# Patient Record
Sex: Female | Born: 1995 | Race: White | Hispanic: No | Marital: Single
Health system: Southern US, Community
[De-identification: ages and names within clinical notes are randomized; demographics above are authoritative.]

## PROBLEM LIST (undated history)

## (undated) DIAGNOSIS — F419 Anxiety disorder, unspecified: Secondary | ICD-10-CM

---
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
## 2019-05-26 ENCOUNTER — Ambulatory Visit: Payer: Self-pay | Attending: Internal Medicine

## 2019-05-26 DIAGNOSIS — Z23 Encounter for immunization: Secondary | ICD-10-CM

## 2019-05-26 NOTE — Progress Notes (Signed)
   Covid-19 Vaccination Clinic  Name:  Tina Blake    MRN: [id number] DOB: [birth year]  05/26/2019  [name] was observed post Covid-19 immunization for 15 minutes without incident. She was provided with Vaccine Information Sheet and instruction to access the V-Safe system.   [name] was instructed to call 911 with any severe reactions post vaccine: Marland Kitchen Difficulty breathing  . Swelling of face and throat  . A fast heartbeat  . A bad rash all over body  . Dizziness and weakness   Immunizations Administered    Name Date Dose VIS Date Route   Moderna COVID-19 Vaccine 05/26/2019 12:14 PM 0.5 mL 12/2018 Intramuscular   Manufacturer: Moderna   Lot: [id number]   NDC: 92446-286-38

## 2019-06-29 ENCOUNTER — Ambulatory Visit: Payer: Self-pay | Attending: Internal Medicine

## 2019-06-29 DIAGNOSIS — Z23 Encounter for immunization: Secondary | ICD-10-CM

## 2019-06-29 NOTE — Progress Notes (Signed)
   Covid-19 Vaccination Clinic  Name:  Tina Blake    MRN: [id number] DOB: [birth year]  06/29/2019  [name] was observed post Covid-19 immunization for 15 minutes without incident. She was provided with Vaccine Information Sheet and instruction to access the V-Safe system.   [name] was instructed to call 911 with any severe reactions post vaccine: Marland Kitchen Difficulty breathing  . Swelling of face and throat  . A fast heartbeat  . A bad rash all over body  . Dizziness and weakness   Immunizations Administered    Name Date Dose VIS Date Route   Moderna COVID-19 Vaccine 06/29/2019 11:52 AM 0.5 mL 12/2018 Intramuscular   Manufacturer: Moderna   Lot: [id number]   NDC: 32003-794-44

## 2020-10-24 ENCOUNTER — Ambulatory Visit: Payer: Self-pay

## 2020-10-24 ENCOUNTER — Encounter: Payer: Self-pay | Admitting: Emergency Medicine

## 2020-10-24 ENCOUNTER — Ambulatory Visit
Admission: EM | Admit: 2020-10-24 | Discharge: 2020-10-24 | Disposition: A | Discharge status: Home / Self Care | Payer: BC Managed Care – PPO | Attending: Internal Medicine | Admitting: Internal Medicine

## 2020-10-24 ENCOUNTER — Other Ambulatory Visit: Payer: Self-pay

## 2020-10-24 DIAGNOSIS — J019 Acute sinusitis, unspecified: Secondary | ICD-10-CM

## 2020-10-24 DIAGNOSIS — B9689 Other specified bacterial agents as the cause of diseases classified elsewhere: Secondary | ICD-10-CM | POA: Diagnosis not present

## 2020-10-24 MED ORDER — AMOXICILLIN-POT CLAVULANATE 875-125 MG PO TABS: 1.0000 | ORAL_TABLET | Freq: Two times a day (BID) | ORAL | 0 refills | Status: AC

## 2020-10-24 NOTE — ED Triage Notes (Signed)
Sore throat and runny nose x 2 weeks.

## 2020-10-24 NOTE — Discharge Instructions (Addendum)
Continue using the fluticasone nasal spray Take antibiotics as prescribed Continue taking over-the-counter allergy medication Return to the urgent care if symptoms worsen.

## 2020-10-25 NOTE — ED Provider Notes (Signed)
[facility]    CSN: [id number] Arrival date & time: 10/24/20  1703      History   Chief Complaint No chief complaint on file.   HPI Tina Blake is a 25 y.o. female comes to urgent care with a 2-week history of nasal congestion, postnasal drip and yellowish nasal discharge.  Patient's symptoms started initially with clear nasal discharge and a sore throat.  She has been using Flonase and over-the-counter allergy medication with no improvement in his symptoms.  She started experiencing postnasal drip with cough as well as sore throat.  The nasal discharge became purulent.  Patient denies any fever or chills.  No pain around the eyes.   HPI  History reviewed. No pertinent past medical history.  There are no problems to display for this patient.   History reviewed. No pertinent surgical history.  OB History   No obstetric history on file.      Home Medications    Prior to Admission medications   Medication Sig Start Date End Date Taking? Authorizing Provider  amoxicillin-clavulanate (AUGMENTIN) 875-125 MG tablet Take 1 tablet by mouth every 12 (twelve) hours for 7 days. 10/24/20 10/31/20 Yes Myrl Bynum, Britta Mccreedy, MD  hydrOXYzine (ATARAX/VISTARIL) 25 MG tablet Take 25 mg by mouth 3 (three) times daily as needed.   Yes [provider]  ibuprofen (ADVIL,MOTRIN) 200 MG tablet Take 200 mg by mouth every 6 (six) hours as needed. For pain    [provider]    Family History Family History  Problem Relation Age of Onset   Healthy Mother    Diabetes Father     Social History Social History   Tobacco Use   Smoking status: Never   Smokeless tobacco: Never  Substance Use Topics   Alcohol use: Not Currently     Allergies   Patient has no known allergies.   Review of Systems Review of Systems  HENT:  Positive for congestion, rhinorrhea, sinus pressure and sore throat.   Respiratory:  Positive for cough. Negative for shortness of breath  and wheezing.   Neurological:  Negative for headaches.    Physical Exam Triage Vital Signs ED Triage Vitals  Enc Vitals Group     BP 10/24/20 1722 116/76     Pulse Rate 10/24/20 1722 98     Resp 10/24/20 1722 18     Temp 10/24/20 1722 98.5 F (36.9 C)     Temp Source 10/24/20 1722 Oral     SpO2 10/24/20 1722 98 %     Weight --      Height --      Head Circumference --      Peak Flow --      Pain Score 10/24/20 1719 5     Pain Loc --      Pain Edu? --      Excl. in GC? --    No data found.  Updated Vital Signs BP 116/76 (BP Location: Right Arm)   Pulse 98   Temp 98.5 F (36.9 C) (Oral)   Resp 18   LMP 10/23/2020 (Exact Date)   SpO2 98%   Visual Acuity Right Eye Distance:   Left Eye Distance:   Bilateral Distance:    Right Eye Near:   Left Eye Near:    Bilateral Near:     Physical Exam Vitals and nursing note reviewed.  Constitutional:      General: She is not in acute distress.    Appearance: She  is not ill-appearing.  HENT:     Right Ear: Tympanic membrane normal.     Left Ear: Tympanic membrane normal.     Mouth/Throat:     Pharynx: No posterior oropharyngeal erythema.  Eyes:     Pupils: Pupils are equal, round, and reactive to light.  Cardiovascular:     Rate and Rhythm: Normal rate and regular rhythm.     Pulses: Normal pulses.     Heart sounds: Normal heart sounds.  Neurological:     Mental Status: She is alert.     UC Treatments / Results  Labs (all labs ordered are listed, but only abnormal results are displayed) Labs Reviewed - No data to display  EKG   Radiology No results found.  Procedures Procedures (including critical care time)  Medications Ordered in UC Medications - No data to display  Initial Impression / Assessment and Plan / UC Course  I have reviewed the triage vital signs and the nursing notes.  Pertinent labs & imaging results that were available during my care of the patient were reviewed by me and considered  in my medical decision making (see chart for details).     1.  Acute bacterial sinusitis > 10 days duration: Augmentin 1 tablet twice daily for 7 days Continue Flonase use Continue over-the-counter allergy medication Saline nasal spray to help with nasal congestion and nasal discharge Return to urgent care if symptoms worsen. Final Clinical Impressions(s) / UC Diagnoses   Final diagnoses:  Acute bacterial sinusitis     Discharge Instructions      Continue using the fluticasone nasal spray Take antibiotics as prescribed Continue taking over-the-counter allergy medication Return to the urgent care if symptoms worsen.   ED Prescriptions     Medication Sig Dispense Auth. Provider   amoxicillin-clavulanate (AUGMENTIN) 875-125 MG tablet Take 1 tablet by mouth every 12 (twelve) hours for 7 days. 14 tablet Shandria Clinch, Britta Mccreedy, MD      PDMP not reviewed this encounter.   Merrilee Jansky, MD 10/25/20 1330

## 2020-12-11 ENCOUNTER — Encounter: Payer: Self-pay | Admitting: Emergency Medicine

## 2020-12-11 ENCOUNTER — Other Ambulatory Visit: Payer: Self-pay

## 2020-12-11 ENCOUNTER — Ambulatory Visit
Admission: EM | Admit: 2020-12-11 | Discharge: 2020-12-11 | Disposition: A | Discharge status: Home / Self Care | Payer: BC Managed Care – PPO | Attending: Family Medicine | Admitting: Family Medicine

## 2020-12-11 DIAGNOSIS — J069 Acute upper respiratory infection, unspecified: Secondary | ICD-10-CM | POA: Diagnosis not present

## 2020-12-11 DIAGNOSIS — Z20822 Contact with and (suspected) exposure to covid-19: Secondary | ICD-10-CM

## 2020-12-11 DIAGNOSIS — J111 Influenza due to unidentified influenza virus with other respiratory manifestations: Secondary | ICD-10-CM

## 2020-12-11 HISTORY — DX: Anxiety disorder, unspecified: F41.9

## 2020-12-11 MED ORDER — PROMETHAZINE-DM 6.25-15 MG/5ML PO SYRP: 5.0000 mL | ORAL_SOLUTION | Freq: Four times a day (QID) | ORAL | 0 refills | Status: DC | PRN

## 2020-12-11 MED ORDER — OSELTAMIVIR PHOSPHATE 75 MG PO CAPS: 75.0000 mg | ORAL_CAPSULE | Freq: Two times a day (BID) | ORAL | 0 refills | Status: AC

## 2020-12-11 NOTE — Discharge Instructions (Signed)

## 2020-12-11 NOTE — ED Triage Notes (Signed)
Patient c/o generalized body aches, fever, and nonproductive cough x 1 day.   Patient endorses sore throat and headache  Patient endorses dizziness and nausea.   Patient endorses a temperature of 99.6 F at home.   Patient has taken Mucinex with no relief of symptoms.

## 2020-12-11 NOTE — ED Provider Notes (Signed)
  [facility]   [id number] 12/11/20 Arrival Time: 0844  ASSESSMENT & PLAN:  1. Viral URI with cough   2. Influenza-like illness    Discussed typical duration of viral illnesses. COVID-19/flu testing declined. OTC symptom care as needed. High suspicion for influenza.  Begin: Meds ordered this encounter  Medications   oseltamivir (TAMIFLU) 75 MG capsule    Sig: Take 1 capsule (75 mg total) by mouth 2 (two) times daily for 5 days.    Dispense:  10 capsule    Refill:  0   promethazine-dextromethorphan (PROMETHAZINE-DM) 6.25-15 MG/5ML syrup    Sig: Take 5 mLs by mouth 4 (four) times daily as needed for cough.    Dispense:  118 mL    Refill:  0     Follow-up Information     [facility].   Specialty: Urgent Care Why: As needed. Contact information: [address] [place] [address] [phone]                Reviewed expectations re: course of current medical issues. Questions answered. Outlined signs and symptoms indicating need for more acute intervention. Understanding verbalized. After Visit Summary given.   SUBJECTIVE: History from: patient. Tina Blake is a 25 y.o. female who reports: body aches, fever, non-prod cough; x 1 day; very fatigued. Low grade temp at home. Denies: difficulty breathing. Normal PO intake without n/v/d.   OBJECTIVE:  Vitals:   12/11/20 1004  BP: 120/81  Pulse: (!) 125  Resp: 18  Temp: 98.9 F (37.2 C)  TempSrc: Oral  SpO2: 96%    Tachycardia noted; reg.  General appearance: alert; no distress Eyes: PERRLA; EOMI; conjunctiva normal HENT: [place]; AT; with nasal congestion Neck: supple  Lungs: speaks full sentences without difficulty; unlabored; dry cough Extremities: no edema Skin: warm and dry Neurologic: normal gait Psychological: alert and cooperative; normal mood and affect    No Known Allergies  Past Medical History:  Diagnosis Date    Anxiety    Social History   Socioeconomic History   Marital status: Single    Spouse name: Not on file   Number of children: Not on file   Years of education: Not on file   Highest education level: Not on file  Occupational History   Not on file  Tobacco Use   Smoking status: Never   Smokeless tobacco: Never  Substance and Sexual Activity   Alcohol use: Not Currently   Drug use: Not on file   Sexual activity: Not on file  Other Topics Concern   Not on file  Social History Narrative   ** Merged History Encounter **       Social Determinants of Health   Financial Resource Strain: Not on file  Food Insecurity: Not on file  Transportation Needs: Not on file  Physical Activity: Not on file  Stress: Not on file  Social Connections: Not on file  Intimate Partner Violence: Not on file   Family History  Problem Relation Age of Onset   Healthy Mother    Diabetes Father    History reviewed. No pertinent surgical history.   Mardella Layman, MD 12/11/20 1020

## 2020-12-12 LAB — COVID-19, FLU A+B NAA
Influenza A, NAA: DETECTED — AB
Influenza B, NAA: NOT DETECTED
SARS-CoV-2, NAA: NOT DETECTED

## 2021-11-24 ENCOUNTER — Ambulatory Visit: Payer: BC Managed Care – PPO | Admitting: Family Medicine

## 2021-11-25 ENCOUNTER — Telehealth: Payer: BC Managed Care – PPO | Admitting: Physician Assistant

## 2021-11-25 DIAGNOSIS — B9689 Other specified bacterial agents as the cause of diseases classified elsewhere: Secondary | ICD-10-CM | POA: Diagnosis not present

## 2021-11-25 DIAGNOSIS — J019 Acute sinusitis, unspecified: Secondary | ICD-10-CM

## 2021-11-25 MED ORDER — AMOXICILLIN-POT CLAVULANATE 875-125 MG PO TABS: 1.0000 | ORAL_TABLET | Freq: Two times a day (BID) | ORAL | 0 refills | Status: DC

## 2021-11-25 MED ORDER — BENZONATATE 100 MG PO CAPS: 100.0000 mg | ORAL_CAPSULE | Freq: Three times a day (TID) | ORAL | 0 refills | Status: DC | PRN

## 2021-11-25 MED ORDER — FLUTICASONE PROPIONATE 50 MCG/ACT NA SUSP: 2.0000 | Freq: Every day | NASAL | 0 refills | Status: DC

## 2021-11-25 NOTE — Patient Instructions (Signed)
Celesta Aver, thank you for joining Piedad Climes, PA-C for today's virtual visit.  While this provider is not your primary care provider (PCP), if your PCP is located in our provider database this encounter information will be shared with them immediately following your visit.   A [facility] MyChart account gives you access to today's visit and all your visits, tests, and labs performed at [facility] " click here if you don't have a [facility] MyChart account or go to [email or web address]  Consent: (Patient) Tina Blake provided verbal consent for this virtual visit at the beginning of the encounter.  Current Medications:  Current Outpatient Medications:    FLUoxetine (PROZAC) 40 MG capsule, Take 40 mg by mouth daily as needed., Disp: , Rfl:    hydrOXYzine (ATARAX/VISTARIL) 25 MG tablet, Take 25 mg by mouth 3 (three) times daily as needed., Disp: , Rfl:    ibuprofen (ADVIL,MOTRIN) 200 MG tablet, Take 200 mg by mouth every 6 (six) hours as needed. For pain, Disp: , Rfl:    promethazine-dextromethorphan (PROMETHAZINE-DM) 6.25-15 MG/5ML syrup, Take 5 mLs by mouth 4 (four) times daily as needed for cough., Disp: 118 mL, Rfl: 0   Medications ordered in this encounter:  No orders of the defined types were placed in this encounter.    *If you need refills on other medications prior to your next appointment, please contact your pharmacy*  Follow-Up: Call back or seek an in-person evaluation if the symptoms worsen or if the condition fails to improve as anticipated.  [facility] [phone]  Other Instructions Please take antibiotic as directed.  Increase fluid intake.  Use Saline nasal spray.  Take a daily multivitamin. Use the Flonase and Tessalon as directed.  Place a humidifier in the bedroom.  Please call or return clinic if symptoms are not improving.  Sinusitis Sinusitis is redness, soreness, and swelling (inflammation) of the  paranasal sinuses. Paranasal sinuses are air pockets within the bones of your face (beneath the eyes, the middle of the forehead, or above the eyes). In healthy paranasal sinuses, mucus is able to drain out, and air is able to circulate through them by way of your nose. However, when your paranasal sinuses are inflamed, mucus and air can become trapped. This can allow bacteria and other germs to grow and cause infection. Sinusitis can develop quickly and last only a short time (acute) or continue over a long period (chronic). Sinusitis that lasts for more than 12 weeks is considered chronic.  CAUSES  Causes of sinusitis include: Allergies. Structural abnormalities, such as displacement of the cartilage that separates your nostrils (deviated septum), which can decrease the air flow through your nose and sinuses and affect sinus drainage. Functional abnormalities, such as when the small hairs (cilia) that line your sinuses and help remove mucus do not work properly or are not present. SYMPTOMS  Symptoms of acute and chronic sinusitis are the same. The primary symptoms are pain and pressure around the affected sinuses. Other symptoms include: Upper toothache. Earache. Headache. Bad breath. Decreased sense of smell and taste. A cough, which worsens when you are lying flat. Fatigue. Fever. Thick drainage from your nose, which often is green and may contain pus (purulent). Swelling and warmth over the affected sinuses. DIAGNOSIS  Your caregiver will perform a physical exam. During the exam, your caregiver may: Look in your nose for signs of abnormal growths in your nostrils (nasal polyps). Tap over the affected sinus to check for  signs of infection. View the inside of your sinuses (endoscopy) with a special imaging device with a light attached (endoscope), which is inserted into your sinuses. If your caregiver suspects that you have chronic sinusitis, one or more of the following tests may be  recommended: Allergy tests. Nasal culture A sample of mucus is taken from your nose and sent to a lab and screened for bacteria. Nasal cytology A sample of mucus is taken from your nose and examined by your caregiver to determine if your sinusitis is related to an allergy. TREATMENT  Most cases of acute sinusitis are related to a viral infection and will resolve on their own within 10 days. Sometimes medicines are prescribed to help relieve symptoms (pain medicine, decongestants, nasal steroid sprays, or saline sprays).  However, for sinusitis related to a bacterial infection, your caregiver will prescribe antibiotic medicines. These are medicines that will help kill the bacteria causing the infection.  Rarely, sinusitis is caused by a fungal infection. In theses cases, your caregiver will prescribe antifungal medicine. For some cases of chronic sinusitis, surgery is needed. Generally, these are cases in which sinusitis recurs more than 3 times per year, despite other treatments. HOME CARE INSTRUCTIONS  Drink plenty of water. Water helps thin the mucus so your sinuses can drain more easily. Use a humidifier. Inhale steam 3 to 4 times a day (for example, sit in the bathroom with the shower running). Apply a warm, moist washcloth to your face 3 to 4 times a day, or as directed by your caregiver. Use saline nasal sprays to help moisten and clean your sinuses. Take over-the-counter or prescription medicines for pain, discomfort, or fever only as directed by your caregiver. SEEK IMMEDIATE MEDICAL CARE IF: You have increasing pain or severe headaches. You have nausea, vomiting, or drowsiness. You have swelling around your face. You have vision problems. You have a stiff neck. You have difficulty breathing. MAKE SURE YOU:  Understand these instructions. Will watch your condition. Will get help right away if you are not doing well or get worse. Document Released: 01/05/2005 Document Revised:  03/30/2011 Document Reviewed: 01/20/2011 [facility] Patient Information 2014 [place], [place].    If you have been instructed to have an in-person evaluation today at a local Urgent Care facility, please use the link below. It will take you to a list of all of our available [facility], including address, phone number and hours of operation. Please do not delay care.  [facility]  If you or a family member do not have a primary care provider, use the link below to schedule a visit and establish care. When you choose a [facility] primary care physician or advanced practice provider, you gain a long-term partner in health. Find a Primary Care Provider  Learn more about [facility]'s in-office and virtual care options: [facility]

## 2021-11-25 NOTE — Progress Notes (Signed)
Virtual Visit Consent   Tina Blake, you are scheduled for a virtual visit with a [facility] provider today. Just as with appointments in the office, your consent must be obtained to participate. Your consent will be active for this visit and any virtual visit you may have with one of our providers in the next 365 days. If you have a MyChart account, a copy of this consent can be sent to you electronically.  As this is a virtual visit, video technology does not allow for your provider to perform a traditional examination. This may limit your provider's ability to fully assess your condition. If your provider identifies any concerns that need to be evaluated in person or the need to arrange testing (such as labs, EKG, etc.), we will make arrangements to do so. Although advances in technology are sophisticated, we cannot ensure that it will always work on either your end or our end. If the connection with a video visit is poor, the visit may have to be switched to a telephone visit. With either a video or telephone visit, we are not always able to ensure that we have a secure connection.  By engaging in this virtual visit, you consent to the provision of healthcare and authorize for your insurance to be billed (if applicable) for the services provided during this visit. Depending on your insurance coverage, you may receive a charge related to this service.  I need to obtain your verbal consent now. Are you willing to proceed with your visit today? Tina Blake has provided verbal consent on 11/25/2021 for a virtual visit (video or telephone). [name], [place]  Date: 11/25/2021 12:30 PM  Virtual Visit via Video Note   I, [name], connected with  Tina Blake  ([id number], 10-Oct-1995) on 11/25/21 at 12:30 PM EST by a video-enabled telemedicine application and verified that I am speaking with the correct person using two identifiers.  Location: Patient: Virtual Visit  Location Patient: Home Provider: Virtual Visit Location Provider: Home Office   I discussed the limitations of evaluation and management by telemedicine and the availability of in person appointments. The patient expressed understanding and agreed to proceed.    History of Present Illness: Tina Blake is a 26 y.o. who identifies as a female who was assigned female at birth, and is being seen today for possible sinusitis. Notes over 1 week of nasal and head congestion with sinus pressure, headache and sinus pain. Also with a cough secondary to drainage that is sometimes productive. Denies fever, chills, chest pain. Some windedness after a coughing spell. Has taken OTC Dayquil/Nyquil and Mucinex    HPI: HPI  Problems: There are no problems to display for this patient.   Allergies: No Known Allergies Medications:  Current Outpatient Medications:    amoxicillin-clavulanate (AUGMENTIN) 875-125 MG tablet, Take 1 tablet by mouth 2 (two) times daily., Disp: 14 tablet, Rfl: 0   benzonatate (TESSALON) 100 MG capsule, Take 1 capsule (100 mg total) by mouth 3 (three) times daily as needed for cough., Disp: 30 capsule, Rfl: 0   fluticasone (FLONASE) 50 MCG/ACT nasal spray, Place 2 sprays into both nostrils daily., Disp: 16 g, Rfl: 0   FLUoxetine (PROZAC) 40 MG capsule, Take 40 mg by mouth daily as needed., Disp: , Rfl:    hydrOXYzine (ATARAX/VISTARIL) 25 MG tablet, Take 25 mg by mouth 3 (three) times daily as needed., Disp: , Rfl:    ibuprofen (ADVIL,MOTRIN) 200 MG tablet, Take 200  mg by mouth every 6 (six) hours as needed. For pain, Disp: , Rfl:   Observations/Objective: Patient is well-developed, well-nourished in no acute distress.  Resting comfortably at home.  Head is normocephalic, atraumatic.  No labored breathing. Speech is clear and coherent with logical content.  Patient is alert and oriented at baseline.   Assessment and Plan: 1. Acute bacterial sinusitis - amoxicillin-clavulanate  (AUGMENTIN) 875-125 MG tablet; Take 1 tablet by mouth 2 (two) times daily.  Dispense: 14 tablet; Refill: 0 - fluticasone (FLONASE) 50 MCG/ACT nasal spray; Place 2 sprays into both nostrils daily.  Dispense: 16 g; Refill: 0 - benzonatate (TESSALON) 100 MG capsule; Take 1 capsule (100 mg total) by mouth 3 (three) times daily as needed for cough.  Dispense: 30 capsule; Refill: 0  Rx Augmentin.  Increase fluids.  Rest.  Saline nasal spray.  Probiotic.  Mucinex as directed.  Humidifier in bedroom. Flonase and Tessalon per orders.  Call or return to clinic if symptoms are not improving.   Follow Up Instructions: I discussed the assessment and treatment plan with the patient. The patient was provided an opportunity to ask questions and all were answered. The patient agreed with the plan and demonstrated an understanding of the instructions.  A copy of instructions were sent to the patient via MyChart unless otherwise noted below.   The patient was advised to call back or seek an in-person evaluation if the symptoms worsen or if the condition fails to improve as anticipated.  Time:  I spent 10 minutes with the patient via telehealth technology discussing the above problems/concerns.    Piedad Climes, PA-C

## 2021-12-22 ENCOUNTER — Ambulatory Visit: Payer: BC Managed Care – PPO | Admitting: Family Medicine

## 2021-12-22 ENCOUNTER — Encounter: Payer: Self-pay | Admitting: Family Medicine

## 2021-12-22 VITALS — BP 118/79 | HR 86 | Ht 66.75 in | Wt 293.8 lb

## 2021-12-22 DIAGNOSIS — F411 Generalized anxiety disorder: Secondary | ICD-10-CM

## 2021-12-22 DIAGNOSIS — Z130 Encounter for screening for diseases of the blood and blood-forming organs and certain disorders involving the immune mechanism: Secondary | ICD-10-CM

## 2021-12-22 DIAGNOSIS — Z23 Encounter for immunization: Secondary | ICD-10-CM

## 2021-12-22 MED ORDER — HYDROXYZINE HCL 25 MG PO TABS: 25.0000 mg | ORAL_TABLET | Freq: Three times a day (TID) | ORAL | 1 refills | Status: DC | PRN

## 2021-12-22 MED ORDER — FLUOXETINE HCL 40 MG PO CAPS: 40.0000 mg | ORAL_CAPSULE | Freq: Every day | ORAL | 3 refills | Status: DC

## 2021-12-22 NOTE — Assessment & Plan Note (Signed)
Restarting Prozac and Atarax.

## 2021-12-22 NOTE — Patient Instructions (Signed)
Medications as prescribed.  Follow up annually.  Be sure to get your pap smears regularly (typically every 3 years).  Take care  Dr. Adriana Simas

## 2021-12-22 NOTE — Progress Notes (Signed)
Subjective:  Patient ID: Tina Blake, female    DOB: [birth year]  Age: 26 y.o. MRN: [id number]  CC: Chief Complaint  Patient presents with   Establish Care    Pt here toe establish care. No former PCP.    HPI:  26 year old female presents to establish care.  Patient states that she suffers from anxiety.  She was previously on Prozac and hydroxyzine.  She states that she is having difficulty with anxiety and would like to restart her medication.  In regards to her preventative health care, she is amenable to having a flu vaccine today.  She states that her Pap smear was last done at [facility] in [place].  This was done approximately 2 years ago.  Patient needs screening labs.  Social Hx   Social History   Socioeconomic History   Marital status: Single    Spouse name: Not on file   Number of children: Not on file   Years of education: Not on file   Highest education level: Not on file  Occupational History   Not on file  Tobacco Use   Smoking status: Never   Smokeless tobacco: Never  Substance and Sexual Activity   Alcohol use: Not Currently   Drug use: Not on file   Sexual activity: Not on file  Other Topics Concern   Not on file  Social History Narrative   ** Merged History Encounter **       Social Determinants of Health   Financial Resource Strain: Not on file  Food Insecurity: Not on file  Transportation Needs: Not on file  Physical Activity: Not on file  Stress: Not on file  Social Connections: Not on file    Review of Systems  Constitutional: Negative.   Respiratory: Negative.    Cardiovascular: Negative.    Objective:  BP 118/79   Pulse 86   Ht 5' 6.75" (1.695 m)   Wt 293 lb 12.8 oz (133.3 kg)   SpO2 97%   BMI 46.36 kg/m      12/22/2021    3:07 PM 12/11/2020   10:04 AM 10/24/2020    5:22 PM  BP/Weight  Systolic BP 485 462 703  Diastolic BP 79 81 76  Wt. (Lbs) 293.8    BMI 46.36 kg/m2     Physical Exam Vitals and  nursing note reviewed.  Constitutional:      General: She is not in acute distress.    Appearance: Normal appearance. She is obese.  HENT:     Head: Normocephalic and atraumatic.     Right Ear: Tympanic membrane normal.     Left Ear: Tympanic membrane normal.     Mouth/Throat:     Pharynx: Oropharynx is clear.  Eyes:     General:        Right eye: No discharge.        Left eye: No discharge.     Conjunctiva/sclera: Conjunctivae normal.  Cardiovascular:     Rate and Rhythm: Normal rate and regular rhythm.  Pulmonary:     Effort: Pulmonary effort is normal.     Breath sounds: Normal breath sounds.  Abdominal:     General: There is no distension.     Palpations: Abdomen is soft.     Tenderness: There is no abdominal tenderness.  Neurological:     Mental Status: She is alert.  Psychiatric:        Mood and Affect: Mood normal.  Behavior: Behavior normal.     Assessment & Plan:   Problem List Items Addressed This Visit       Other   GAD (generalized anxiety disorder)    Restarting Prozac and Atarax.      Relevant Medications   hydrOXYzine (ATARAX) 25 MG tablet   FLUoxetine (PROZAC) 40 MG capsule   Other Visit Diagnoses     Need for immunization against influenza    -  Primary   Relevant Orders   Flu Vaccine QUAD 22moIM (Fluarix, Fluzone & Alfiuria Quad PF) (Completed)   Screening for deficiency anemia       Relevant Orders   CBC   Morbid obesity ([place]       Relevant Orders   CMP14+EGFR   Hemoglobin A1c   Lipid panel       Meds ordered this encounter  Medications   hydrOXYzine (ATARAX) 25 MG tablet    Sig: Take 1 tablet (25 mg total) by mouth 3 (three) times daily as needed for anxiety.    Dispense:  90 tablet    Refill:  1   FLUoxetine (PROZAC) 40 MG capsule    Sig: Take 1 capsule (40 mg total) by mouth daily.    Dispense:  90 capsule    Refill:  3    Follow-up:  Return in about 1 year (around 12/23/2022).  JWeir

## 2021-12-23 ENCOUNTER — Encounter: Payer: Self-pay | Admitting: *Deleted

## 2021-12-23 LAB — LIPID PANEL
Chol/HDL Ratio: 3.6 ratio (ref 0.0–4.4)
Cholesterol, Total: 141 mg/dL (ref 100–199)
HDL: 39 mg/dL — ABNORMAL LOW (ref >39)
LDL Chol Calc (NIH): 88 mg/dL (ref 0–99)
Triglycerides: 70 mg/dL (ref 0–149)
VLDL Cholesterol Cal: 14 mg/dL (ref 5–40)

## 2021-12-23 LAB — CMP14+EGFR
ALT: 19 IU/L (ref 0–32)
AST: 17 IU/L (ref 0–40)
Albumin/Globulin Ratio: 1.8 (ref 1.2–2.2)
Albumin: 4.5 g/dL (ref 4.0–5.0)
Alkaline Phosphatase: 88 IU/L (ref 44–121)
BUN/Creatinine Ratio: 13 (ref 9–23)
BUN: 9 mg/dL (ref 6–20)
Bilirubin Total: 0.2 mg/dL (ref 0.0–1.2)
CO2: 21 mmol/L (ref 20–29)
Calcium: 9.7 mg/dL (ref 8.7–10.2)
Chloride: 104 mmol/L (ref 96–106)
Creatinine, Ser: 0.7 mg/dL (ref 0.57–1.00)
Globulin, Total: 2.5 g/dL (ref 1.5–4.5)
Glucose: 91 mg/dL (ref 70–99)
Potassium: 4.1 mmol/L (ref 3.5–5.2)
Sodium: 143 mmol/L (ref 134–144)
Total Protein: 7 g/dL (ref 6.0–8.5)
eGFR: 124 mL/min/{1.73_m2} (ref >59)

## 2021-12-23 LAB — CBC
Hematocrit: 39.1 % (ref 34.0–46.6)
Hemoglobin: 13.5 g/dL (ref 11.1–15.9)
MCH: 31.5 pg (ref 26.6–33.0)
MCHC: 34.5 g/dL (ref 31.5–35.7)
MCV: 91 fL (ref 79–97)
Platelets: 353 10*3/uL (ref 150–450)
RBC: 4.29 x10E6/uL (ref 3.77–5.28)
RDW: 11.9 % (ref 11.7–15.4)
WBC: 10.6 10*3/uL (ref 3.4–10.8)

## 2021-12-23 LAB — HEMOGLOBIN A1C
Est. average glucose Bld gHb Est-mCnc: 105 mg/dL
Hgb A1c MFr Bld: 5.3 % (ref 4.8–5.6)

## 2022-01-26 ENCOUNTER — Ambulatory Visit: Payer: BC Managed Care – PPO | Admitting: Family Medicine

## 2022-01-26 VITALS — BP 110/78 | HR 90 | Temp 98.2°F | Ht 67.75 in | Wt 285.0 lb

## 2022-01-26 DIAGNOSIS — G43909 Migraine, unspecified, not intractable, without status migrainosus: Secondary | ICD-10-CM

## 2022-01-26 NOTE — Assessment & Plan Note (Addendum)
Form filled out for exemption to get her windows tinted.

## 2022-01-26 NOTE — Progress Notes (Addendum)
Subjective:  Patient ID: Tina Blake, female    DOB: 1995/09/23  Age: 27 y.o. MRN: [id number]  CC: Chief Complaint  Patient presents with   Headache    HPI:  26 year old female presents for evaluation of the above.   Patient suffers from headaches/migraines.  Exacerbated by bright light.  She is wanting to have the windows of her car tented.  She would like a medical exemption to be allowed to do this.  Patient Active Problem List   Diagnosis Date Noted   Migraine 01/26/2022   GAD (generalized anxiety disorder) 12/22/2021    Social Hx   Social History   Socioeconomic History   Marital status: Single    Spouse name: Not on file   Number of children: Not on file   Years of education: Not on file   Highest education level: Not on file  Occupational History   Not on file  Tobacco Use   Smoking status: Never   Smokeless tobacco: Never  Substance and Sexual Activity   Alcohol use: Not Currently   Drug use: Not on file   Sexual activity: Not on file  Other Topics Concern   Not on file  Social History Narrative   ** Merged History Encounter **       Social Determinants of Health   Financial Resource Strain: Not on file  Food Insecurity: Not on file  Transportation Needs: Not on file  Physical Activity: Not on file  Stress: Not on file  Social Connections: Not on file    Review of Systems  Neurological:  Positive for headaches.       Photophobia   Objective:  BP 110/78   Pulse 90   Temp 98.2 F (36.8 C) (Oral)   Ht 5' 7.75" (1.721 m)   Wt 285 lb (129.3 kg)   SpO2 96%   BMI 43.65 kg/m      01/26/2022    2:53 PM 12/22/2021    3:07 PM 12/11/2020   10:04 AM  BP/Weight  Systolic BP 993 716 967  Diastolic BP 78 79 81  Wt. (Lbs) 285 293.8   BMI 43.65 kg/m2 46.36 kg/m2     Physical Exam Vitals and nursing note reviewed.  Constitutional:      Appearance: Normal appearance. She is obese.  HENT:     Head: Normocephalic and atraumatic.  Eyes:      General:        Right eye: No discharge.        Left eye: No discharge.     Conjunctiva/sclera: Conjunctivae normal.  Pulmonary:     Effort: Pulmonary effort is normal. No respiratory distress.  Neurological:     General: No focal deficit present.     Mental Status: She is alert.     Lab Results  Component Value Date   WBC 10.6 12/22/2021   HGB 13.5 12/22/2021   HCT 39.1 12/22/2021   PLT 353 12/22/2021   GLUCOSE 91 12/22/2021   CHOL 141 12/22/2021   TRIG 70 12/22/2021   HDL 39 (L) 12/22/2021   LDLCALC 88 12/22/2021   ALT 19 12/22/2021   AST 17 12/22/2021   NA 143 12/22/2021   K 4.1 12/22/2021   CL 104 12/22/2021   CREATININE 0.70 12/22/2021   BUN 9 12/22/2021   CO2 21 12/22/2021   HGBA1C 5.3 12/22/2021     Assessment & Plan:   Problem List Items Addressed This Visit  Cardiovascular and Mediastinum   Migraine - Primary    Form filled out for exemption to get her windows tinted.      Everlene Other DO [facility]

## 2022-02-25 ENCOUNTER — Ambulatory Visit
Admission: EM | Admit: 2022-02-25 | Discharge: 2022-02-25 | Disposition: A | Discharge status: Home / Self Care | Payer: BC Managed Care – PPO | Attending: Nurse Practitioner | Admitting: Nurse Practitioner

## 2022-02-25 ENCOUNTER — Encounter: Payer: Self-pay | Admitting: Emergency Medicine

## 2022-02-25 ENCOUNTER — Other Ambulatory Visit: Payer: Self-pay

## 2022-02-25 DIAGNOSIS — J0140 Acute pansinusitis, unspecified: Secondary | ICD-10-CM

## 2022-02-25 MED ORDER — PSEUDOEPH-BROMPHEN-DM 30-2-10 MG/5ML PO SYRP: 5.0000 mL | ORAL_SOLUTION | Freq: Four times a day (QID) | ORAL | 0 refills | Status: DC | PRN

## 2022-02-25 MED ORDER — DOXYCYCLINE HYCLATE 100 MG PO TABS: 100.0000 mg | ORAL_TABLET | Freq: Two times a day (BID) | ORAL | 0 refills | Status: DC

## 2022-02-25 NOTE — ED Triage Notes (Signed)
Pt reports nasal congestion, fever x2 weeks. Denies any improvement with otc medication.

## 2022-02-25 NOTE — ED Provider Notes (Signed)
[facility]    CSN: [id number] Arrival date & time: 02/25/22  [place]      History   Chief Complaint Chief Complaint  Patient presents with   Nasal Congestion    HPI Tina Blake is a 27 y.o. female.   HPI  Past Medical History:  Diagnosis Date   Anxiety     Patient Active Problem List   Diagnosis Date Noted   Migraine 01/26/2022   GAD (generalized anxiety disorder) 12/22/2021    History reviewed. No pertinent surgical history.  OB History   No obstetric history on file.      Home Medications    Prior to Admission medications   Medication Sig Start Date End Date Taking? Authorizing Provider  brompheniramine-pseudoephedrine-DM 30-2-10 MG/5ML syrup Take 5 mLs by mouth 4 (four) times daily as needed. 02/25/22  Yes Mirl Hillery-Warren, Alda Lea, NP  doxycycline (VIBRA-TABS) 100 MG tablet Take 1 tablet (100 mg total) by mouth 2 (two) times daily. 02/25/22  Yes Findley Vi-Warren, Alda Lea, NP  FLUoxetine (PROZAC) 40 MG capsule Take 1 capsule (40 mg total) by mouth daily. 12/22/21   Coral Spikes, DO  fluticasone (FLONASE) 50 MCG/ACT nasal spray Place 2 sprays into both nostrils daily. 11/25/21   Brunetta Jeans, PA-C  hydrOXYzine (ATARAX) 25 MG tablet Take 1 tablet (25 mg total) by mouth 3 (three) times daily as needed for anxiety. 12/22/21   Coral Spikes, DO    Family History Family History  Problem Relation Age of Onset   Healthy Mother    Diabetes Father     Social History Social History   Tobacco Use   Smoking status: Never   Smokeless tobacco: Never  Substance Use Topics   Alcohol use: Not Currently     Allergies   Penicillin g   Review of Systems Review of Systems   Physical Exam Triage Vital Signs ED Triage Vitals  Enc Vitals Group     BP 02/25/22 1745 94/65     Pulse Rate 02/25/22 1745 77     Resp 02/25/22 1745 20     Temp 02/25/22 1745 98.5 F (36.9 C)     Temp Source 02/25/22 1745 Oral     SpO2 02/25/22 1745 98 %     Weight  --      Height --      Head Circumference --      Peak Flow --      Pain Score 02/25/22 1744 4     Pain Loc --      Pain Edu? --      Excl. in [place]? --    No data found.  Updated Vital Signs BP 94/65 (BP Location: Right Arm)   Pulse 77   Temp 98.5 F (36.9 C) (Oral)   Resp 20   LMP 02/15/2022 (Approximate)   SpO2 98%   Visual Acuity Right Eye Distance:   Left Eye Distance:   Bilateral Distance:    Right Eye Near:   Left Eye Near:    Bilateral Near:     Physical Exam Vitals and nursing note reviewed.  Constitutional:      General: She is not in acute distress.    Appearance: Normal appearance.  HENT:     Head: Normocephalic.     Right Ear: Tympanic membrane, ear canal and external ear normal.     Left Ear: Tympanic membrane, ear canal and external ear normal.     Nose: Congestion and rhinorrhea present.  Right Turbinates: Enlarged and swollen.     Left Turbinates: Enlarged and swollen.     Right Sinus: Maxillary sinus tenderness and frontal sinus tenderness present.     Left Sinus: Maxillary sinus tenderness and frontal sinus tenderness present.     Mouth/Throat:     Mouth: Mucous membranes are moist.     Pharynx: Posterior oropharyngeal erythema present. No oropharyngeal exudate.     Comments: Cobblestoning present on posterior oropharynx Eyes:     Extraocular Movements: Extraocular movements intact.     Conjunctiva/sclera: Conjunctivae normal.     Pupils: Pupils are equal, round, and reactive to light.  Cardiovascular:     Rate and Rhythm: Normal rate and regular rhythm.     Pulses: Normal pulses.     Heart sounds: Normal heart sounds.  Pulmonary:     Effort: Pulmonary effort is normal.     Breath sounds: Normal breath sounds.  Abdominal:     General: Bowel sounds are normal.     Palpations: Abdomen is soft.     Tenderness: There is no abdominal tenderness.  Musculoskeletal:     Cervical back: Normal range of motion.  Lymphadenopathy:     Cervical:  No cervical adenopathy.  Skin:    General: Skin is warm and dry.  Neurological:     General: No focal deficit present.     Mental Status: She is alert and oriented to person, place, and time.  Psychiatric:        Mood and Affect: Mood normal.      UC Treatments / Results  Labs (all labs ordered are listed, but only abnormal results are displayed) Labs Reviewed - No data to display  EKG   Radiology No results found.  Procedures Procedures (including critical care time)  Medications Ordered in UC Medications - No data to display  Initial Impression / Assessment and Plan / UC Course  I have reviewed the triage vital signs and the nursing notes.  Pertinent labs & imaging results that were available during my care of the patient were reviewed by me and considered in my medical decision making (see chart for details).  Patient is well-appearing, she is in no acute distress, vital signs are stable.  Symptoms are consistent with acute sinusitis.  Symptoms have been present for more than 10 days, patient also with both maxillary and frontal sinus tenderness.  Will start patient on doxycycline 100 mg twice daily for 10 days, and Bromfed-DM for her cough.  Supportive care recommendations were provided to the patient to include use of over-the-counter analgesics for pain or discomfort, normal saline nasal spray, and use of a humidifier in her bedroom at nighttime during sleep.  Discussed indications of when follow-up may be indicated.  Patient verbalizes understanding.  All questions were answered.  Patient stable for discharge.   Final Clinical Impressions(s) / UC Diagnoses   Final diagnoses:  Acute pansinusitis, recurrence not specified     Discharge Instructions      Take medication as prescribed.  Use your current allergy medication regimen. Increase fluids and allow for plenty of rest. Recommend over-the-counter analgesics such as Tylenol or ibuprofen as needed for pain,  fever, or general discomfort. Recommend normal saline nasal spray throughout today to help with nasal congestion. For the cough, recommend using a humidifier in your bedroom at nighttime during sleep.  This will also help with nasal congestion. Symptoms do not improve with this treatment, please follow-up with your primary care physician for further  evaluation. Follow-up as needed.      ED Prescriptions     Medication Sig Dispense Auth. Provider   doxycycline (VIBRA-TABS) 100 MG tablet Take 1 tablet (100 mg total) by mouth 2 (two) times daily. 20 tablet Lylah Lantis-Warren, Alda Lea, NP   brompheniramine-pseudoephedrine-DM 30-2-10 MG/5ML syrup Take 5 mLs by mouth 4 (four) times daily as needed. 140 mL Sapphire Tygart-Warren, Alda Lea, NP      PDMP not reviewed this encounter.   Tish Men, NP 02/25/22 [phone]

## 2022-02-25 NOTE — Discharge Instructions (Addendum)
Take medication as prescribed.  Use your current allergy medication regimen. Increase fluids and allow for plenty of rest. Recommend over-the-counter analgesics such as Tylenol or ibuprofen as needed for pain, fever, or general discomfort. Recommend normal saline nasal spray throughout today to help with nasal congestion. For the cough, recommend using a humidifier in your bedroom at nighttime during sleep.  This will also help with nasal congestion. Symptoms do not improve with this treatment, please follow-up with your primary care physician for further evaluation. Follow-up as needed.

## 2022-04-24 ENCOUNTER — Encounter: Payer: Self-pay | Admitting: Nurse Practitioner

## 2022-04-24 ENCOUNTER — Ambulatory Visit: Payer: BC Managed Care – PPO | Admitting: Nurse Practitioner

## 2022-04-24 VITALS — BP 98/62 | HR 91 | Temp 97.2°F | Ht 67.75 in | Wt 272.0 lb

## 2022-04-24 DIAGNOSIS — N6019 Diffuse cystic mastopathy of unspecified breast: Secondary | ICD-10-CM

## 2022-04-24 DIAGNOSIS — E282 Polycystic ovarian syndrome: Secondary | ICD-10-CM | POA: Diagnosis not present

## 2022-04-24 DIAGNOSIS — N6002 Solitary cyst of left breast: Secondary | ICD-10-CM

## 2022-04-24 NOTE — Progress Notes (Unsigned)
   Subjective:    Patient ID: Tina Blake, female    DOB: 25-Jun-1995, 25 y.o.   MRN: [id number]  HPI Lump in left breast for a couple of weeks, no pain  Has noticed faint discoloration in the area. No nipple discharge or warmth/erythema. Regular cycles with normal flow.  Not on contraceptives. Mid cycle today. Has PCOS. Diet high in caffeine.  Paternal grandmother: ovarian cancer around 50-60. Maternal grandmother: breast cysts removed. Mother had cysts removed in her her 30s. Unsure about breast cancer diagnosis but will get more information.        Objective:   Physical Exam NAD. Alert, oriented. Lungs clear. Heart RRR. Multiple fine nodularity in both breasts. Axillae: no adenopathy. Very faint discoloration noted between 9-12 o'clock left breast. No mass or nodule noted in this area. No nipple discharge expressed. Two small rubbery cysts noted at 6 o'clock outer breast. Non tender.  Today's Vitals   04/24/22 1025  BP: 98/62  Pulse: 91  Temp: (!) 97.2 F (36.2 C)  SpO2: 99%  Weight: 272 lb (123.4 kg)  Height: 5' 7.75" (1.721 m)   Body mass index is 41.66 kg/m.       Assessment & Plan:  1. Fibrocystic breast disease (FCBD), unspecified laterality Recommend decreasing caffeine in her diet.   - [place] LIMITED ULTRASOUND INCLUDING AXILLA LEFT BREAST  - [place] LIMITED ULTRASOUND INCLUDING AXILLA RIGHT BREAST  2. Cyst of left breast Due to family history, will order an US of the left breast.   - [place] LIMITED ULTRASOUND INCLUDING AXILLA LEFT BREAST  - [place] LIMITED ULTRASOUND INCLUDING AXILLA RIGHT BREAST  3. PCOS (polycystic ovarian syndrome) Discussed hormone changes associated with PCOS. Most recent labs 12/22/21. Recommend preventive health physical sometime over the next few months.  Return for Recommend appointment for physical .

## 2022-04-25 ENCOUNTER — Encounter: Payer: Self-pay | Admitting: Nurse Practitioner

## 2022-04-25 DIAGNOSIS — N6019 Diffuse cystic mastopathy of unspecified breast: Secondary | ICD-10-CM | POA: Insufficient documentation

## 2022-05-12 ENCOUNTER — Ambulatory Visit (HOSPITAL_COMMUNITY)
Admission: RE | Admit: 2022-05-12 | Discharge: 2022-05-12 | Disposition: A | Discharge status: Home / Self Care | Payer: BC Managed Care – PPO | Source: Ambulatory Visit | Attending: Nurse Practitioner | Admitting: Nurse Practitioner

## 2022-05-12 DIAGNOSIS — N6002 Solitary cyst of left breast: Secondary | ICD-10-CM | POA: Diagnosis present

## 2022-05-12 DIAGNOSIS — N6019 Diffuse cystic mastopathy of unspecified breast: Secondary | ICD-10-CM | POA: Insufficient documentation

## 2023-02-09 NOTE — Progress Notes (Signed)
Name: Tina Blake DOB: 13-May-1995 MRN: [id number]  History of Present Illness: [name] is a 28 y.o. female who presents today as a new patient at [facility]. All available relevant medical records have been reviewed.   She reports concern of kidney stone(s).  She denies prior history of kidney stones.  Recent history: > 01/20/2023: Seen in ER for right flank pain. CMP with mildly elevated creatinine (1.21). GFR >60. CBC with leukocytosis (WBC 13.4). UA showed 2 WBC/hpf, 10 RBC/hpf, bacteria (small). CT showed a 3 mm right UVJ stone with associated mild hydroureteronephrosis. No additional stones bilaterally. She was discharged with prescriptions for Cipro for suspected UTI, Flomax, Zofran, and Toradol, and Tramadol 50 mg (#10 dispensed 01/21/2023 per PMP aware controlled substance registry).  Today: She denies seeing the stone pass. She denies flank pain or abdominal pain. She denies fevers, nausea, or vomiting.  She denies increased urinary urgency, frequency, nocturia, dysuria, gross hematuria, hesitancy, straining to void, or sensations of incomplete emptying.   Fall Screening: Do you usually have a device to assist in your mobility? No   Medications: Current Outpatient Medications  Medication Sig Dispense Refill   FLUoxetine (PROZAC) 40 MG capsule Take 1 capsule (40 mg total) by mouth daily. 90 capsule 3   brompheniramine-pseudoephedrine-DM 30-2-10 MG/5ML syrup Take 5 mLs by mouth 4 (four) times daily as needed. (Patient not taking: Reported on 02/17/2023) 140 mL 0   doxycycline (VIBRA-TABS) 100 MG tablet Take 1 tablet (100 mg total) by mouth 2 (two) times daily. (Patient not taking: Reported on 02/17/2023) 20 tablet 0   fluticasone (FLONASE) 50 MCG/ACT nasal spray Place 2 sprays into both nostrils daily. (Patient not taking: Reported on 02/17/2023) 16 g 0   hydrOXYzine (ATARAX) 25 MG tablet Take 1 tablet (25 mg total) by mouth 3 (three) times daily as  needed for anxiety. (Patient not taking: Reported on 02/17/2023) 90 tablet 1   No current facility-administered medications for this visit.    Allergies: Allergies  Allergen Reactions   Penicillin G     Childhood allergy    Past Medical History:  Diagnosis Date   Anxiety    History reviewed. No pertinent surgical history. Family History  Problem Relation Age of Onset   Healthy Mother    Diabetes Father    Social History   Socioeconomic History   Marital status: Single    Spouse name: Not on file   Number of children: Not on file   Years of education: Not on file   Highest education level: Not on file  Occupational History   Not on file  Tobacco Use   Smoking status: Never   Smokeless tobacco: Never  Substance and Sexual Activity   Alcohol use: Not Currently   Drug use: Not on file   Sexual activity: Not on file  Other Topics Concern   Not on file  Social History Narrative   ** Merged History Encounter **       Social Drivers of Health   Financial Resource Strain: Not on file  Food Insecurity: Not on file  Transportation Needs: Not on file  Physical Activity: Not on file  Stress: Not on file  Social Connections: Not on file  Intimate Partner Violence: Not on file    SUBJECTIVE  Review of Systems Constitutional: Patient denies any unintentional weight loss or change in strength lntegumentary: Patient denies any rashes or pruritus Cardiovascular: Patient denies chest pain or syncope Respiratory: Patient denies shortness of  breath Gastrointestinal: Patient denies nausea, vomiting, constipation, or diarrhea Musculoskeletal: Patient denies muscle cramps or weakness Neurologic: Patient denies convulsions or seizures Allergic/Immunologic: Patient denies recent allergic reaction(s) Hematologic/Lymphatic: Patient denies bleeding tendencies Endocrine: Patient denies heat/cold intolerance  GU: As per HPI.  OBJECTIVE Vitals:   02/17/23 1344  BP: 125/77   Pulse: (!) 112   There is no height or weight on file to calculate BMI.  Physical Examination Constitutional: No obvious distress; patient is non-toxic appearing  Cardiovascular: No visible lower extremity edema.  Respiratory: The patient does not have audible wheezing/stridor; respirations do not appear labored  Gastrointestinal: Abdomen non-distended Musculoskeletal: Normal ROM of UEs  Skin: No obvious rashes/open sores  Neurologic: CN 2-12 grossly intact Psychiatric: Answered questions appropriately with normal affect  Hematologic/Lymphatic/Immunologic: No obvious bruises or sites of spontaneous bleeding  Urine microscopy: 6-10 WBC/hpf, 0-2 RBC/hpf, many bacteria  ASSESSMENT Right ureteral stone - Plan: Urinalysis, Routine w reflex microscopic, DG Abd 1 View  Kidney stones - Plan: DG Abd 1 View  We reviewed history and results; discussed likely stone passage based on stone characteristics and absence of symptoms at this time. Will obtain KUB this week for recheck. Advised adequate hydration for stone prevention.   UA today appears abnormal however patient is asymptomatic for UTI, therefore acute antibiotic treatment is not advised at this time.   Will plan for follow up in 6 months with KUB for stone surveillance or sooner if needed. Pt verbalized understanding and agreement. All questions were answered.   PLAN Advised the following: KUB this week. Return in about 6 months (around 08/17/2023) for KUB, UA, & f/u with Evette Georges NP.  Orders Placed This Encounter  Procedures   DG Abd 1 View    Standing Status:   Future    Expected Date:   02/17/2023    Expiration Date:   02/17/2024    Reason for Exam (SYMPTOM  OR DIAGNOSIS REQUIRED):   kidney stone    Is patient pregnant?:   Unknown (Please Explain)    Preferred imaging location?:   [facility]   Urinalysis, Routine w reflex microscopic    It has been explained that the patient is to follow regularly with their  PCP in addition to all other providers involved in their care and to follow instructions provided by these respective offices. Patient advised to contact urology clinic if any urologic-pertaining questions, concerns, new symptoms or problems arise in the interim period.  There are no Patient Instructions on file for this visit.  Electronically signed by: Donnita Falls, MSN, FNP-C, CUNP 02/17/2023 2:06 PM

## 2023-02-17 ENCOUNTER — Ambulatory Visit: Payer: 59 | Admitting: Urology

## 2023-02-17 ENCOUNTER — Encounter: Payer: Self-pay | Admitting: Urology

## 2023-02-17 VITALS — BP 125/77 | HR 112

## 2023-02-17 DIAGNOSIS — Z87442 Personal history of urinary calculi: Secondary | ICD-10-CM | POA: Diagnosis not present

## 2023-02-17 DIAGNOSIS — Z09 Encounter for follow-up examination after completed treatment for conditions other than malignant neoplasm: Secondary | ICD-10-CM | POA: Diagnosis not present

## 2023-02-17 DIAGNOSIS — N200 Calculus of kidney: Secondary | ICD-10-CM | POA: Insufficient documentation

## 2023-02-17 DIAGNOSIS — N201 Calculus of ureter: Secondary | ICD-10-CM

## 2023-02-17 LAB — URINALYSIS, ROUTINE W REFLEX MICROSCOPIC
Bilirubin, UA: NEGATIVE
Glucose, UA: NEGATIVE
Ketones, UA: NEGATIVE
Nitrite, UA: NEGATIVE
Protein,UA: NEGATIVE
RBC, UA: NEGATIVE
Specific Gravity, UA: 1.03 (ref 1.005–1.030)
Urobilinogen, Ur: 0.2 mg/dL (ref 0.2–1.0)
pH, UA: 6 (ref 5.0–7.5)

## 2023-02-17 LAB — MICROSCOPIC EXAMINATION: Epithelial Cells (non renal): >10 /[HPF] — AB (ref 0–10)

## 2023-02-25 ENCOUNTER — Ambulatory Visit: Payer: 59 | Admitting: Obstetrics & Gynecology

## 2023-04-07 ENCOUNTER — Ambulatory Visit: Payer: 59 | Admitting: Adult Health

## 2023-04-28 ENCOUNTER — Ambulatory Visit: Admitting: Obstetrics and Gynecology

## 2023-04-28 ENCOUNTER — Encounter: Payer: Self-pay | Admitting: Obstetrics and Gynecology

## 2023-04-28 ENCOUNTER — Other Ambulatory Visit (HOSPITAL_COMMUNITY)
Admission: RE | Admit: 2023-04-28 | Discharge: 2023-04-28 | Disposition: A | Discharge status: Home / Self Care | Source: Ambulatory Visit | Attending: Obstetrics and Gynecology | Admitting: Obstetrics and Gynecology

## 2023-04-28 VITALS — BP 133/80 | HR 108 | Ht 70.0 in | Wt 298.0 lb

## 2023-04-28 DIAGNOSIS — L680 Hirsutism: Secondary | ICD-10-CM | POA: Diagnosis not present

## 2023-04-28 DIAGNOSIS — E282 Polycystic ovarian syndrome: Secondary | ICD-10-CM | POA: Diagnosis not present

## 2023-04-28 DIAGNOSIS — F419 Anxiety disorder, unspecified: Secondary | ICD-10-CM

## 2023-04-28 DIAGNOSIS — Z01419 Encounter for gynecological examination (general) (routine) without abnormal findings: Secondary | ICD-10-CM | POA: Insufficient documentation

## 2023-04-28 DIAGNOSIS — Z124 Encounter for screening for malignant neoplasm of cervix: Secondary | ICD-10-CM | POA: Insufficient documentation

## 2023-04-28 DIAGNOSIS — Z3202 Encounter for pregnancy test, result negative: Secondary | ICD-10-CM

## 2023-04-28 LAB — POCT URINE PREGNANCY: Preg Test, Ur: NEGATIVE

## 2023-04-28 MED ORDER — METFORMIN HCL 1000 MG PO TABS: ORAL_TABLET | ORAL | 5 refills | Status: AC

## 2023-04-28 MED ORDER — SPIRONOLACTONE 50 MG PO TABS: 50.0000 mg | ORAL_TABLET | Freq: Two times a day (BID) | ORAL | 2 refills | Status: DC

## 2023-04-28 MED ORDER — ESCITALOPRAM OXALATE 10 MG PO TABS: 10.0000 mg | ORAL_TABLET | Freq: Every day | ORAL | 3 refills | Status: DC

## 2023-04-28 NOTE — Progress Notes (Signed)
 ANNUAL EXAM Patient name: Tina Blake MRN [id number]  Date of birth: 1995-09-06 Chief Complaint:   new gyn (Annual.1 st pap smear)  History of Present Illness:   Tina Blake is a 28 y.o. No obstetric history on file.  female being seen today for a routine annual exam.  Current complaints: history of PCOS, dx at 46. HIstory of irreg periods, but regular periods now. Has hirsutism, shaving 2-3 x a week. Reports trouble losing weight.  Not on birth control currently and not sexually active currently. Reports hx of anxiety, was previously on lexapro  Patient's last menstrual period was 04/21/2023.   The pregnancy intention screening data noted above was reviewed. Potential methods of contraception were discussed. The patient elected to proceed with Abstinence.   Last pap n/a. Results were: N/A. H/O abnormal pap: no Last mammogram: n/a. Results were: N/A. Family h/o breast cancer: yes paternal grandmother  Had breast u/s 4/23 d/t lump in breast. Negative u/s      04/28/2023    3:29 PM 04/24/2022   10:30 AM 01/26/2022    3:00 PM 12/22/2021    3:06 PM  Depression screen PHQ 2/9  Decreased Interest 0 1 0 0  Down, Depressed, Hopeless 1 0 1 0  PHQ - 2 Score 1 1 1  0  Altered sleeping 2 2 0   Tired, decreased energy 2 0 0   Change in appetite 0 1 1   Feeling bad or failure about yourself  1 0 0   Trouble concentrating 0 0 0   Moving slowly or fidgety/restless 0 0 0   Suicidal thoughts 0 0 0   PHQ-9 Score 6 4 2    Difficult doing work/chores  Not difficult at all Not difficult at all         04/28/2023    3:29 PM 04/24/2022   10:31 AM 01/26/2022    3:01 PM 12/22/2021    3:06 PM  GAD 7 : Generalized Anxiety Score  Nervous, Anxious, on Edge 2 2 1 3   Control/stop worrying 2 2 1 3   Worry too much - different things 3 2 1 3   Trouble relaxing 2 0 0 1  Restless 1 0 0 0  Easily annoyed or irritable 3 0 1 2  Afraid - awful might happen 3 2 1 2   Total GAD 7 Score 16 8 5 14   Anxiety  Difficulty  Not difficult at all Somewhat difficult Somewhat difficult     Review of Systems:   Pertinent items are noted in HPI Denies any headaches, blurred vision, fatigue, shortness of breath, chest pain, abdominal pain, abnormal vaginal discharge/itching/odor/irritation, problems with periods, bowel movements, urination, or intercourse unless otherwise stated above. Pertinent History Reviewed:  Reviewed past medical,surgical, social and family history.  Reviewed problem list, medications and allergies. Physical Assessment:   Vitals:   04/28/23 1526  BP: 133/80  Pulse: (!) 108  Weight: 298 lb (135.2 kg)  Height: 5\' 10"  (1.778 m)  Body mass index is 42.76 kg/m.        Physical Examination:   General appearance - well appearing, and in no distress  Mental status - alert, oriented   Psych:  She has a normal mood and affect  Skin - warm and dry, normal color, no suspicious lesions noted,evidence course hair chin  Chest - effort normal, all lung fields clear to auscultation bilaterally  Heart - normal rate and regular rhythm  Neck:  midline trachea, no thyromegaly or nodules  Breasts - breasts appear normal, no suspicious masses, no skin or nipple changes or  axillary nodes  Abdomen - soft, nontender, nondistended, no masses or organomegaly  Pelvic - VULVA: normal appearing vulva with no masses, tenderness or lesions  VAGINA: normal appearing vagina with normal color and discharge, no lesions  CERVIX: normal appearing cervix without discharge or lesions, no CMT  Thin prep pap is done   UTERUS: uterus is felt to be normal size, shape, consistency and nontender   ADNEXA: No adnexal masses or tenderness noted.  Extremities:  No swelling or varicosities noted  Chaperone present for exam  No results found for this or any previous visit (from the past 24 hours).  Assessment & Plan:  1. Encounter for annual routine gynecological examination (Primary) Encouraged self breast  exam Follows with PCP  Pap today  - TSH - Comp Met (CMET) - CBC - POCT urine pregnancy - Cytology - PAP( [place])  2. PCOS (polycystic ovarian syndrome) 3. Hirsutism -  Reviewed long term implication, she reports trouble with weight loss, hirsutism, questions regarding fertility - Labwork for FLP and HgBA1C: Due - new Rx given - Currently having regular cycle. UPT neg -Desires management of hirsutism with BC vs spironolactone, desires spironolactone today. Discussed importance of bc or pregnancy prevention if resumes sexual activity    - spironolactone (ALDACTONE) 50 MG tablet; Take 1 tablet (50 mg total) by mouth 2 (two) times daily. Will start with 50 mg twice daily  Dispense: 60 tablet; Refill: 2 - metFORMIN (GLUCOPHAGE) 1000 MG tablet; Take 1 tablet once a day  Dispense: 60 tablet; Refill: 5 - HgB A1c - Testosterone,Free and Total  3. Cervical cancer screening First pap smear today, discussed process and demonstrated process prior to completing - Cytology - PAP( [place])  4. Anxiety Desires restarting lexapro, also interested in counseling. Discussed starting lexapro, expected side effects  - Amb ref to Integrated Behavioral Health - escitalopram (LEXAPRO) 10 MG tablet; Take 1 tablet (10 mg total) by mouth daily.  Dispense: 30 tablet; Refill: 3   Labs/procedures today:   Mammogram: @ 28yo, or sooner if problems   Orders Placed This Encounter  Procedures   TSH   Comp Met (CMET)   CBC   HgB A1c   Testosterone,Free and Total   Amb ref to Integrated Behavioral Health   POCT urine pregnancy    Meds:  Meds ordered this encounter  Medications   spironolactone (ALDACTONE) 50 MG tablet    Sig: Take 1 tablet (50 mg total) by mouth 2 (two) times daily. Will start with 50 mg twice daily    Dispense:  60 tablet    Refill:  2   metFORMIN (GLUCOPHAGE) 1000 MG tablet    Sig: Take 1 tablet once a day    Dispense:  60 tablet    Refill:  5   escitalopram (LEXAPRO)  10 MG tablet    Sig: Take 1 tablet (10 mg total) by mouth daily.    Dispense:  30 tablet    Refill:  3    Follow-up: Return 2-3 week follow up with Christphor Groft. Future Appointments  Date Time Provider Department Center  05/11/2023  3:50 PM Zelma Hidden, FNP [facility]  08/17/2023  3:00 PM Lauretta Ponto, FNP [facility] None     Susi Eric, FNP

## 2023-04-29 LAB — CBC
Hematocrit: 41.9 % (ref 34.0–46.6)
Hemoglobin: 14.1 g/dL (ref 11.1–15.9)
MCH: 31.7 pg (ref 26.6–33.0)
MCHC: 33.7 g/dL (ref 31.5–35.7)
MCV: 94 fL (ref 79–97)
Platelets: 388 10*3/uL (ref 150–450)
RBC: 4.45 x10E6/uL (ref 3.77–5.28)
RDW: 11.8 % (ref 11.7–15.4)
WBC: 10.9 10*3/uL — ABNORMAL HIGH (ref 3.4–10.8)

## 2023-04-29 LAB — COMPREHENSIVE METABOLIC PANEL WITH GFR
ALT: 18 IU/L (ref 0–32)
AST: 17 IU/L (ref 0–40)
Albumin: 4.4 g/dL (ref 4.0–5.0)
Alkaline Phosphatase: 109 IU/L (ref 44–121)
BUN/Creatinine Ratio: 15 (ref 9–23)
BUN: 13 mg/dL (ref 6–20)
Bilirubin Total: 0.3 mg/dL (ref 0.0–1.2)
CO2: 24 mmol/L (ref 20–29)
Calcium: 10.2 mg/dL (ref 8.7–10.2)
Chloride: 101 mmol/L (ref 96–106)
Creatinine, Ser: 0.88 mg/dL (ref 0.57–1.00)
Globulin, Total: 3 g/dL (ref 1.5–4.5)
Glucose: 91 mg/dL (ref 70–99)
Potassium: 4.3 mmol/L (ref 3.5–5.2)
Sodium: 142 mmol/L (ref 134–144)
Total Protein: 7.4 g/dL (ref 6.0–8.5)
eGFR: 93 mL/min/{1.73_m2} (ref >59)

## 2023-04-29 LAB — TSH: TSH: 2.54 u[IU]/mL (ref 0.450–4.500)

## 2023-04-29 LAB — HEMOGLOBIN A1C
Est. average glucose Bld gHb Est-mCnc: 97 mg/dL
Hgb A1c MFr Bld: 5 % (ref 4.8–5.6)

## 2023-05-02 DIAGNOSIS — F419 Anxiety disorder, unspecified: Secondary | ICD-10-CM | POA: Insufficient documentation

## 2023-05-02 DIAGNOSIS — L680 Hirsutism: Secondary | ICD-10-CM | POA: Insufficient documentation

## 2023-05-04 LAB — CYTOLOGY - PAP
Adequacy: ABSENT
Comment: NEGATIVE
Diagnosis: NEGATIVE
High risk HPV: NEGATIVE

## 2023-05-11 ENCOUNTER — Encounter: Payer: Self-pay | Admitting: Obstetrics and Gynecology

## 2023-05-11 ENCOUNTER — Ambulatory Visit: Admitting: Obstetrics and Gynecology

## 2023-05-11 VITALS — BP 120/80 | HR 89 | Ht 69.0 in | Wt 295.0 lb

## 2023-05-11 DIAGNOSIS — L680 Hirsutism: Secondary | ICD-10-CM

## 2023-05-11 DIAGNOSIS — F419 Anxiety disorder, unspecified: Secondary | ICD-10-CM

## 2023-05-11 DIAGNOSIS — E282 Polycystic ovarian syndrome: Secondary | ICD-10-CM | POA: Diagnosis not present

## 2023-05-11 NOTE — Patient Instructions (Signed)
Tina Blake is a virtual mental health platform available to our patients   We can refer you to a local mental health provider or you can refer yourself to this online platform using the link below  [email or web address]

## 2023-05-11 NOTE — Progress Notes (Addendum)
   GYNECOLOGY PROGRESS NOTE  History:  28 y.o. No obstetric history on file. presents to [facility] for follow up. Started Lexapro  10 mg last visit as well as spironolactone  and metformin . Doing well on Lexapro , reports decrease in anxiety. Reports already seeing improvement with spironolactone .    The following portions of the patient's history were reviewed and updated as appropriate: allergies, current medications, past family history, past medical history, past social history, past surgical history and problem list. Last pap smear on 04/28/23 NILM  Health Maintenance Due  Topic Date Due   HPV VACCINES (1 - Risk 3-dose series) Never done   HIV Screening  Never done   Hepatitis C Screening  Never done   DTaP/Tdap/Td (1 - Tdap) Never done     Review of Systems:  Pertinent items are noted in HPI.   Objective:  Physical Exam Blood pressure 120/80, pulse 89, height 5\' 9"  (1.753 m), weight 295 lb (133.8 kg), last menstrual period 04/21/2023. VS reviewed, nursing note reviewed,  Constitutional: well developed, well nourished, no distress Pulm/chest wall: normal effort Breast Exam: deferred Neuro: alert and oriented  Skin: warm, dry Psych: affect normal Pelvic exam: deferred  Assessment & Plan:   1. Anxiety (Primary) Continue lexapro  10mg  for now, waiting to establish with counselor, provided other counselor information today as well   2. PCOS (polycystic ovarian syndrome) 3. Hirsutism Continue metformin  and spironolactone    Return in 3 months for follow up with Anette Kehr, FNP

## 2023-06-28 ENCOUNTER — Telehealth: Payer: Self-pay

## 2023-06-28 NOTE — Telephone Encounter (Signed)
 Patient would like for her LEXAPRO  increased to from 10 mg to 20 mg.

## 2023-06-29 NOTE — Telephone Encounter (Signed)
 Left message @ 4:40 pm @ 613 number. JSY

## 2023-06-30 MED ORDER — HYDROXYZINE HCL 10 MG PO TABS
10.0000 mg | ORAL_TABLET | Freq: Three times a day (TID) | ORAL | 3 refills | Status: AC | PRN
Start: 2023-06-30 — End: ?

## 2023-06-30 MED ORDER — ESCITALOPRAM OXALATE 20 MG PO TABS: 20.0000 mg | ORAL_TABLET | Freq: Every day | ORAL | 2 refills | Status: DC

## 2023-06-30 NOTE — Telephone Encounter (Signed)
 Pt wants Lexapro  increased from 10 to 20 mg. Pt states her anxiety is increasing. First started med in April. Pt is due for refill today. Please advise. Thanks! JSY

## 2023-06-30 NOTE — Telephone Encounter (Signed)
 She is feeling anxious and thinks lexapro  needs increasing to 20 mg,has been on 10 mg since April, when saw [facility], will increase lexapro  to 20 mg and rx atarax  10 mg  to use prn, and get appt scheduled for end of July with Susi Eric in follow up.

## 2023-07-12 ENCOUNTER — Ambulatory Visit (INDEPENDENT_AMBULATORY_CARE_PROVIDER_SITE_OTHER): Payer: Self-pay | Admitting: Clinical

## 2023-07-12 DIAGNOSIS — F4310 Post-traumatic stress disorder, unspecified: Secondary | ICD-10-CM

## 2023-07-12 NOTE — BH Specialist Note (Unsigned)
 Integrated Behavioral Health via Telemedicine Visit  07/13/2023 Tina Blake [id number]  Number of Integrated Behavioral Health Clinician visits: 1- Initial Visit  Session Start time: 1418   Session End time: 1520  Total time in minutes: 62    Referring Provider: Nidia Daring, FNP Patient/Family location: [facility] Provider location: [facility]  All persons participating in visit: [name] and [facility] Derrico Zhong   Types of Service: Individual psychotherapy and Video visit  I connected with Tina Blake and/or Tina Blake's n/a via  Telephone or Video Enabled Telemedicine Application  (Video is Caregility application) and verified that I am speaking with the correct person using two identifiers. Discussed confidentiality: Yes   I discussed the limitations of telemedicine and the availability of in person appointments.  Discussed there is a possibility of technology failure and discussed alternative modes of communication if that failure occurs.  I discussed that engaging in this telemedicine visit, they consent to the provision of behavioral healthcare and the services will be billed under their insurance.  Patient and/or legal guardian expressed understanding and consented to Telemedicine visit: Yes   Presenting Concerns: Patient and/or family reports the following symptoms/concerns: Anxiety, worry, irritability, fear, fatigue, sleep difficulty; attributes increased anxiety to work stress/work changes and unclear communication at work. Pt worries about first week back to school in the fall: School starts, with uncertain changes(8/25th), birthday(8/26th), 3 years since loss of brother to overdose (8/29th), over 10 years since loss of father to suicide (8/31st). Pt had panic attack last time between 2-3 weeks ago; struggles with being a people pleaser. Pt is taking Lexapro  as prescribed; open to  self-coping strategy today and looking forward to upcoming vacation.  Duration of problem: Ongoing; Severity of problem: moderately severe  Patient and/or Family's Strengths/Protective Factors: Social connections, Concrete supports in place (healthy food, safe environments, etc.), and Sense of purpose  Goals Addressed: Patient will:  Reduce symptoms of: anxiety, depression, and stress   Increase knowledge and/or ability of: self-management skills   Demonstrate ability to: Increase healthy adjustment to current life circumstances and Increase motivation to adhere to plan of care  Progress towards Goals: Ongoing    Interventions: Interventions utilized:  Mindfulness or Management consultant, Psychoeducation and/or Health Education, and Supportive Reflection Standardized Assessments completed: Not Needed    Patient and/or Family Response: Patient agrees with treatment plan.   Clinical Assessment/Diagnosis  No diagnosis found. .   Patient may benefit from psychoeducation and brief therapeutic interventions regarding coping with symptoms of anxiety, depression, past trauma .  Plan: Follow up with behavioral health clinician on : Three weeks Behavioral recommendations:  -Continue taking Lexapro  as prescribed -Accept referral to psychiatry for ongoing [facility] medication management -CALM relaxation breathing exercise twice daily (morning; at bedtime); as needed throughout the day. -Continue making plans for upcoming (much-needed and deserved) vacation  Referral(s): Integrated Art gallery manager (In Clinic) and [facility] (LME/Outside Clinic)  I discussed the assessment and treatment plan with the patient and/or parent/guardian. They were provided an opportunity to ask questions and all were answered. They agreed with the plan and demonstrated an understanding of the instructions.   They were advised to call back or seek an in-person evaluation if the  symptoms worsen or if the condition fails to improve as anticipated.  Alven Alverio C Morelia Cassells, LCSW     04/28/2023    3:29 PM 04/24/2022   10:30 AM 01/26/2022  3:00 PM 12/22/2021    3:06 PM  Depression screen PHQ 2/9  Decreased Interest 0 1 0 0  Down, Depressed, Hopeless 1 0 1 0  PHQ - 2 Score 1 1 1  0  Altered sleeping 2 2 0   Tired, decreased energy 2 0 0   Change in appetite 0 1 1   Feeling bad or failure about yourself  1 0 0   Trouble concentrating 0 0 0   Moving slowly or fidgety/restless 0 0 0   Suicidal thoughts 0 0 0   PHQ-9 Score 6 4 2    Difficult doing work/chores  Not difficult at all Not difficult at all       04/28/2023    3:29 PM 04/24/2022   10:31 AM 01/26/2022    3:01 PM 12/22/2021    3:06 PM  GAD 7 : Generalized Anxiety Score  Nervous, Anxious, on Edge 2 2 1 3   Control/stop worrying 2 2 1 3   Worry too much - different things 3 2 1 3   Trouble relaxing 2 0 0 1  Restless 1 0 0 0  Easily annoyed or irritable 3 0 1 2  Afraid - awful might happen 3 2 1 2   Total GAD 7 Score 16 8 5 14   Anxiety Difficulty  Not difficult at all Somewhat difficult Somewhat difficult

## 2023-07-13 NOTE — Patient Instructions (Signed)
 Center for [facility] at [facility] [address] [place], [place] [address] [phone] (main office) [phone] (Konner Saiz's office)

## 2023-07-26 ENCOUNTER — Other Ambulatory Visit: Payer: Self-pay | Admitting: *Deleted

## 2023-07-26 DIAGNOSIS — E282 Polycystic ovarian syndrome: Secondary | ICD-10-CM

## 2023-07-27 MED ORDER — SPIRONOLACTONE 50 MG PO TABS: 50.0000 mg | ORAL_TABLET | Freq: Two times a day (BID) | ORAL | 2 refills | Status: AC

## 2023-08-02 ENCOUNTER — Ambulatory Visit: Payer: Self-pay | Admitting: Clinical

## 2023-08-02 DIAGNOSIS — F4310 Post-traumatic stress disorder, unspecified: Secondary | ICD-10-CM | POA: Diagnosis not present

## 2023-08-02 NOTE — BH Specialist Note (Signed)
 Integrated Behavioral Health via Telemedicine Visit  08/02/2023 Tina Blake [id number]  Number of Integrated Behavioral Health Clinician visits: 2- Second Visit  Session Start time: 1558   Session End time: 1648  Total time in minutes: 50  Referring Provider: Nidia Daring, Blake Patient/Family location: Home [facility] Provider location: [facility]  All persons participating in visit: [name] and Tina Blake   Types of Service: Individual psychotherapy and Video visit  I connected with Tina Blake and/or Tina Blake's n/a via  Telephone or Video Enabled Telemedicine Application  (Video is Caregility application) and verified that I am speaking with the correct person using two identifiers. Discussed confidentiality: Yes   I discussed the limitations of telemedicine and the availability of in person appointments.  Discussed there is a possibility of technology failure and discussed alternative modes of communication if that failure occurs.  I discussed that engaging in this telemedicine visit, they consent to the provision of behavioral healthcare and the services will be billed under their insurance.  Patient and/or legal guardian expressed understanding and consented to Telemedicine visit: Yes   Presenting Concerns: Patient and/or family reports the following symptoms/concerns: Has stopped taking Lexapro  for the past three days, after a day of suicidal ideation (with no intent and no plan); pt worries about taking Lexapro  as her father was also on this medication when he committed suicide. Pt is taking hydroxyzine  as needed for panic attacks (last took one on Thursday, same day as SI thoughts), and taking up to 3-4 sleep aid pills to be able to sleep at night with nightmares and difficulty falling asleep. Pt's father and brother both experienced bipolar disorder; pt's brother also experienced  schizophrenia. Pt is not feeling as irritable since stopping Lexapro ; has not heard back from psychiatry referral yet.  Duration of problem: Ongoing; Severity of problem: moderately severe  Patient and/or Family's Strengths/Protective Factors: Social connections, Concrete supports in place (healthy food, safe environments, etc.), and Sense of purpose  Goals Addressed: Patient will:  Reduce symptoms of: anxiety, depression, insomnia, and stress   Increase knowledge and/or ability of: Safety   Demonstrate ability to: Increase motivation to adhere to plan of care  Progress towards Goals: Ongoing    Interventions: Interventions utilized:  Medication Monitoring and Safety Standardized Assessments completed: Not Needed    Patient and/or Family Response: Patient agrees with treatment plan.    Clinical Assessment/Diagnosis  No diagnosis found.    Assessment: Patient currently experiencing PTSD.   Patient may benefit from continued therapeutic intervention; establish care with psychiatry .  Plan: Follow up with behavioral health clinician on : Three weeks Behavioral recommendations:  -Continue taking Hydroxyzine  as needed; continue plan to discontinue Lexapro  -Continue to accept referral to psychiatry; use [facility] walk-in behavioral health urgent care or ED as needed if SI returns -Continue upcoming [place] vacation (bring Hydroxyzine  for any panic attacks while there) Referral(s): Integrated Art gallery manager (In Clinic) and [facility] (LME/Outside Clinic)  I discussed the assessment and treatment plan with the patient and/or parent/guardian. They were provided an opportunity to ask questions and all were answered. They agreed with the plan and demonstrated an understanding of the instructions.   They were advised to call back or seek an in-person evaluation if the symptoms worsen or if the condition fails to improve as anticipated.  Tina Barbe C  Wilborn Membreno, LCSW     04/28/2023    3:29 PM 04/24/2022  10:30 AM 01/26/2022    3:00 PM 12/22/2021    3:06 PM  Depression screen PHQ 2/9  Decreased Interest 0 1 0 0  Down, Depressed, Hopeless 1 0 1 0  PHQ - 2 Score 1 1 1  0  Altered sleeping 2 2 0   Tired, decreased energy 2 0 0   Change in appetite 0 1 1   Feeling bad or failure about yourself  1 0 0   Trouble concentrating 0 0 0   Moving slowly or fidgety/restless 0 0 0   Suicidal thoughts 0 0 0   PHQ-9 Score 6 4 2    Difficult doing work/chores  Not difficult at all Not difficult at all       04/28/2023    3:29 PM 04/24/2022   10:31 AM 01/26/2022    3:01 PM 12/22/2021    3:06 PM  GAD 7 : Generalized Anxiety Score  Nervous, Anxious, on Edge 2 2 1 3   Control/stop worrying 2 2 1 3   Worry too much - different things 3 2 1 3   Trouble relaxing 2 0 0 1  Restless 1 0 0 0  Easily annoyed or irritable 3 0 1 2  Afraid - awful might happen 3 2 1 2   Total GAD 7 Score 16 8 5 14   Anxiety Difficulty  Not difficult at all Somewhat difficult Somewhat difficult

## 2023-08-02 NOTE — Patient Instructions (Signed)

## 2023-08-09 ENCOUNTER — Ambulatory Visit: Admitting: Obstetrics and Gynecology

## 2023-08-17 ENCOUNTER — Ambulatory Visit: Payer: 59 | Admitting: Urology

## 2023-08-18 ENCOUNTER — Ambulatory Visit: Admitting: Obstetrics and Gynecology

## 2023-08-18 VITALS — BP 112/76 | HR 80

## 2023-08-18 DIAGNOSIS — G4700 Insomnia, unspecified: Secondary | ICD-10-CM

## 2023-08-18 DIAGNOSIS — F419 Anxiety disorder, unspecified: Secondary | ICD-10-CM | POA: Diagnosis not present

## 2023-08-18 DIAGNOSIS — F4310 Post-traumatic stress disorder, unspecified: Secondary | ICD-10-CM | POA: Diagnosis not present

## 2023-08-18 MED ORDER — TRAZODONE HCL 50 MG PO TABS: 50.0000 mg | ORAL_TABLET | Freq: Every day | ORAL | 3 refills | Status: DC

## 2023-08-18 MED ORDER — FLUOXETINE HCL 20 MG PO TABS: 40.0000 mg | ORAL_TABLET | Freq: Every day | ORAL | 8 refills | Status: DC

## 2023-08-18 NOTE — Progress Notes (Unsigned)
   GYNECOLOGY PROGRESS NOTE  History:  28 y.o. No obstetric history on file. presents to [facility]. Was taking Lexapro  for anxiety, PTSD. Stopped taking Lexpro given SI thoughts, without intent or plan. Lost a friend during time frame.  Endorses that she lost her father to SI.  She has also had a hard time sleeping since her brother passed a few years ago. She has had to use hydroxyzine  only a few times as needed.   Health Maintenance Due  Topic Date Due   HPV VACCINES (1 - Risk 3-dose series) Never done   HIV Screening  Never done   Hepatitis C Screening  Never done   DTaP/Tdap/Td (1 - Tdap) Never done   Hepatitis B Vaccines (1 of 3 - 19+ 3-dose series) Never done     Review of Systems:  Pertinent items are noted in HPI.   Objective:  Physical Exam Blood pressure 112/76, pulse 80. VS reviewed, nursing note reviewed,  Constitutional: well developed, well nourished, no distress HEENT: normocephalic CV: normal rate Pulm/chest wall: normal effort Breast Exam: deferred Abdomen: soft Neuro: alert and oriented x 3 Skin: warm, dry Psych: affect normal Pelvic exam: Cervix pink, visually closed, without lesion, scant white creamy discharge, vaginal walls and external genitalia normal Bimanual exam: Cervix 0/long/high, firm, anterior, neg CMT, uterus nontender, nonenlarged, adnexa without tenderness, enlargement, or mass  Assessment & Plan:  1. PTSD (post-traumatic stress disorder) (Primary) 2. Anxiety Discussed different options for management such as fluoxetine , sertraline.  She has been on fluoxetine  before and did not have SI thoughts.  We will trial fluoxetine  again discussed how to start it, discussed 24-hour behavioral health urgent care or emergency department with any SI\HI thoughts.  Referral placed for psychiatry.  Continue counseling. - Ambulatory referral to Psychiatry - FLUoxetine  (PROZAC ) 20 MG tablet; Take 2 tablets (40 mg total) by mouth daily. Take one tablet for  one week daily then increase to two tablets daily  Dispense: 60 tablet; Refill: 8  3. Insomnia, unspecified type Discussed option for insomnia will trial trazodone  at bedtime - traZODone  (DESYREL ) 50 MG tablet; Take 1 tablet (50 mg total) by mouth at bedtime.  Dispense: 30 tablet; Refill: 3   Return in 1 month for follow up with Riyan Gavina   Nidia Daring, FNP

## 2023-08-19 ENCOUNTER — Ambulatory Visit: Admitting: Obstetrics and Gynecology

## 2023-08-20 NOTE — BH Specialist Note (Unsigned)
 Integrated Behavioral Health via Telemedicine Visit  08/24/2023 Tina Blake [id number]  Number of Integrated Behavioral Health Clinician visits: 3- Third Visit  Session Start time: 1546   Session End time: 1647  Total time in minutes: 61  Referring Provider: Nidia Daring, FNP Patient/Family location: Home [facility] Provider location: [facility]  All persons participating in visit: [name] and [facility] Hoang Reich   Types of Service: Individual psychotherapy and Video visit   I connected with Tina Blake and/or Tina Blake's n/a via  Telephone or Video Enabled Telemedicine Application  (Video is Caregility application) and verified that I am speaking with the correct person using two identifiers. Discussed confidentiality: Yes   I discussed the limitations of telemedicine and the availability of in person appointments.  Discussed there is a possibility of technology failure and discussed alternative modes of communication if that failure occurs.  I discussed that engaging in this telemedicine visit, they consent to the provision of behavioral healthcare and the services will be billed under their insurance.  Patient and/or legal guardian expressed understanding and consented to Telemedicine visit: Yes   Presenting Concerns:  Patient and/or family reports the following symptoms/concerns: Pt is taking Prozac  with no negative side effects (has taken in the past) and Trazodone  with sleep improvement;(used to take 3 hours to fall asleep; now taking 1.5 hours) took hydroxyzine  last getting off the plane on the way to [place], will continue to take as needed.Pt has not heard back from psychiatry referral.  Duration of problem: Ongoing; Severity of problem: moderately severe  Patient and/or Family's Strengths/Protective Factors: Social connections, Concrete supports in place (healthy food, safe environments, etc.),  and Sense of purpose  Goals Addressed: Patient will:  Reduce symptoms of: anxiety, depression, insomnia, and stress   Demonstrate ability to: Increase motivation to adhere to plan of care  Progress towards Goals: Ongoing    Interventions: Interventions utilized:  Motivational Interviewing, Medication Monitoring, and Supportive Reflection Standardized Assessments completed: Not Needed    Patient and/or Family Response: Patient agrees with treatment plan.   Clinical Assessment/Diagnosis  PTSD (post-traumatic stress disorder)    Patient may benefit from continued therapeutic intervention .  Plan: Follow up with behavioral health clinician on : Two weeks Behavioral recommendations:  -Continue taking Prozac , Trazodone  and hydroxyzine  as prescribed -Continue accepting referral to psychiatry; use walk-in [facility] as needed -Continue increased time with supportive people in life; decreased time with unsupportive people Referral(s): Integrated [employer] (In Clinic)  I discussed the assessment and treatment plan with the patient and/or parent/guardian. They were provided an opportunity to ask questions and all were answered. They agreed with the plan and demonstrated an understanding of the instructions.   They were advised to call back or seek an in-person evaluation if the symptoms worsen or if the condition fails to improve as anticipated.  Warren BROCKS Ashden Sonnenberg, LCSW     08/18/2023    2:11 PM 04/28/2023    3:29 PM 04/24/2022   10:30 AM 01/26/2022    3:00 PM 12/22/2021    3:06 PM  Depression screen PHQ 2/9  Decreased Interest 0 0 1 0 0  Down, Depressed, Hopeless 1 1 0 1 0  PHQ - 2 Score 1 1 1 1  0  Altered sleeping 3 2 2  0   Tired, decreased energy 1 2 0 0   Change in appetite 0 0 1 1   Feeling bad or  failure about yourself  2 1 0 0   Trouble concentrating 0 0 0 0   Moving slowly or fidgety/restless 0 0 0 0   Suicidal thoughts 0 0 0 0   PHQ-9 Score 7 6 4  2    Difficult doing work/chores   Not difficult at all Not difficult at all       08/23/2023    4:12 PM 04/28/2023    3:29 PM 04/24/2022   10:31 AM 01/26/2022    3:01 PM  GAD 7 : Generalized Anxiety Score  Nervous, Anxious, on Edge 1 2 2 1   Control/stop worrying 3 2 2 1   Worry too much - different things 3 3 2 1   Trouble relaxing 0 2 0 0  Restless 0 1 0 0  Easily annoyed or irritable 1 3 0 1  Afraid - awful might happen 0 3 2 1   Total GAD 7 Score 8 16 8 5   Anxiety Difficulty   Not difficult at all Somewhat difficult

## 2023-08-23 ENCOUNTER — Ambulatory Visit (INDEPENDENT_AMBULATORY_CARE_PROVIDER_SITE_OTHER): Payer: Self-pay | Admitting: Clinical

## 2023-08-23 DIAGNOSIS — F4310 Post-traumatic stress disorder, unspecified: Secondary | ICD-10-CM

## 2023-09-14 ENCOUNTER — Encounter: Payer: Self-pay | Admitting: Obstetrics and Gynecology

## 2023-09-14 ENCOUNTER — Ambulatory Visit: Admitting: Obstetrics and Gynecology

## 2023-09-14 VITALS — BP 115/79 | HR 97 | Ht 68.0 in | Wt 298.6 lb

## 2023-09-14 DIAGNOSIS — G4700 Insomnia, unspecified: Secondary | ICD-10-CM | POA: Diagnosis not present

## 2023-09-14 DIAGNOSIS — F419 Anxiety disorder, unspecified: Secondary | ICD-10-CM | POA: Diagnosis not present

## 2023-09-14 DIAGNOSIS — F4310 Post-traumatic stress disorder, unspecified: Secondary | ICD-10-CM | POA: Diagnosis not present

## 2023-09-14 MED ORDER — TRAZODONE HCL 100 MG PO TABS: 100.0000 mg | ORAL_TABLET | Freq: Every day | ORAL | 5 refills | Status: AC

## 2023-09-14 NOTE — Progress Notes (Signed)
   GYNECOLOGY PROGRESS NOTE  History:  28 y.o. No obstetric history on file. presents to [facility] for mood follow up. Started Prozac  last visit as well as trazodone  for sleep.  She reports her mood has improved with the fluoxetine , she is taking 40 mg. She reports sleep is improving, she used to fall asleep at 3-4am and now she is falling asleep around 11-12am.   Health Maintenance Due  Topic Date Due   HPV VACCINES (1 - Risk 3-dose series) Never done   HIV Screening  Never done   Hepatitis C Screening  Never done   DTaP/Tdap/Td (1 - Tdap) Never done   Hepatitis B Vaccines 19-59 Average Risk (1 of 3 - 19+ 3-dose series) Never done   INFLUENZA VACCINE  08/20/2023     Review of Systems:  Pertinent items are noted in HPI.   Objective:  Physical Exam Blood pressure 115/79, pulse 97, height 5' 8 (1.727 m), weight 298 lb 9.6 oz (135.4 kg), last menstrual period 09/13/2023. VS reviewed, nursing note reviewed,  Constitutional: well developed, well nourished, no distress Pulm/chest wall: normal effort Neuro: alert and oriented  Skin: warm, dry Psych: affect normal   Assessment & Plan:  1. Anxiety (Primary) 2. PTSD (post-traumatic stress disorder) Continue current dosing fluoxetine   Continue counseling Has not heard from psych referral placed previously, they attempted to call and left vm, number provided to call back  3. Insomnia, unspecified type Doing better with sleep, will trial increase - traZODone  (DESYREL ) 100 MG tablet; Take 1 tablet (100 mg total) by mouth at bedtime.  Dispense: 30 tablet; Refill: 5   Return in 6 months for follow up with Milas Schappell  Nidia Daring, FNP

## 2023-09-17 NOTE — BH Specialist Note (Deleted)
 Integrated Behavioral Health via Telemedicine Visit  09/17/2023 CARREN BLAKLEY [id number]  Number of Integrated Behavioral Health Clinician visits: 3- Third Visit  Session Start time: 1546   Session End time: 1647  Total time in minutes: 61    Referring Provider: Nidia Daring, FNP Patient/Family location: Home*** [facility] Provider location: [facility]  All persons participating in visit: Patient Taylinn Brabant and [facility] Eliza Grissinger ***  Types of Service: {CHL AMB TYPE OF SERVICE:[id number]}  I connected with Charmaine JONELLE Burrs and/or Charmaine JONELLE Langston's {family members:20773} via  Telephone or Video Enabled Telemedicine Application  (Video is Caregility application) and verified that I am speaking with the correct person using two identifiers. Discussed confidentiality: Yes   I discussed the limitations of telemedicine and the availability of in person appointments.  Discussed there is a possibility of technology failure and discussed alternative modes of communication if that failure occurs.  I discussed that engaging in this telemedicine visit, they consent to the provision of behavioral healthcare and the services will be billed under their insurance.  Patient and/or legal guardian expressed understanding and consented to Telemedicine visit: Yes   Presenting Concerns: Patient and/or family reports the following symptoms/concerns: *** Duration of problem: ***; Severity of problem: {Mild/Moderate/Severe:20260}  Patient and/or Family's Strengths/Protective Factors: {CHL AMB BH PROTECTIVE FACTORS:[phone]}  Goals Addressed: Patient will:  Reduce symptoms of: {IBH Symptoms:21014056}   Increase knowledge and/or ability of: {IBH Patient Tools:21014057}   Demonstrate ability to: {IBH Goals:21014053}  Progress towards Goals: {CHL AMB BH PROGRESS TOWARDS [phone]}    Interventions: Interventions utilized:  {IBH  Interventions:21014054} Standardized Assessments completed: {IBH Screening Tools:21014051}    Patient and/or Family Response: Patient agrees with treatment plan. ***  Clinical Assessment/Diagnosis  No diagnosis found..   Patient may benefit from continued therapeutic intervention *** .  Plan: Follow up with behavioral health clinician on : *** Behavioral recommendations:  -*** -*** Referral(s): {IBH Referrals:21014055}  I discussed the assessment and treatment plan with the patient and/or parent/guardian. They were provided an opportunity to ask questions and all were answered. They agreed with the plan and demonstrated an understanding of the instructions.   They were advised to call back or seek an in-person evaluation if the symptoms worsen or if the condition fails to improve as anticipated.  Warren BROCKS Daryn Hicks, LCSW     08/18/2023    2:11 PM 04/28/2023    3:29 PM 04/24/2022   10:30 AM 01/26/2022    3:00 PM 12/22/2021    3:06 PM  Depression screen PHQ 2/9  Decreased Interest 0 0 1 0 0  Down, Depressed, Hopeless 1 1 0 1 0  PHQ - 2 Score 1 1 1 1  0  Altered sleeping 3 2 2  0   Tired, decreased energy 1 2 0 0   Change in appetite 0 0 1 1   Feeling bad or failure about yourself  2 1 0 0   Trouble concentrating 0 0 0 0   Moving slowly or fidgety/restless 0 0 0 0   Suicidal thoughts 0 0 0 0   PHQ-9 Score 7 6 4 2    Difficult doing work/chores   Not difficult at all Not difficult at all       08/23/2023    4:12 PM 04/28/2023    3:29 PM 04/24/2022   10:31 AM 01/26/2022    3:01 PM  GAD 7 : Generalized Anxiety Score  Nervous, Anxious, on Edge 1 2  2 1  Control/stop worrying 3 2 2 1   Worry too much - different things 3 3 2 1   Trouble relaxing 0 2 0 0  Restless 0 1 0 0  Easily annoyed or irritable 1 3 0 1  Afraid - awful might happen 0 3 2 1   Total GAD 7 Score 8 16 8 5   Anxiety Difficulty   Not difficult at all Somewhat difficult

## 2023-09-21 ENCOUNTER — Encounter

## 2023-09-28 ENCOUNTER — Ambulatory Visit (INDEPENDENT_AMBULATORY_CARE_PROVIDER_SITE_OTHER): Admitting: Clinical

## 2023-09-28 DIAGNOSIS — F4310 Post-traumatic stress disorder, unspecified: Secondary | ICD-10-CM | POA: Diagnosis not present

## 2023-09-28 NOTE — BH Specialist Note (Addendum)
 Integrated Behavioral Health via Telemedicine Visit  10/01/2023 Tina Blake [id number]  Number of Integrated Behavioral Health Clinician visits: 4- Fourth Visit  Session Start time: 1630   Session End time: 1650  Total time in minutes: 20    Referring Provider: Nidia Daring, FNP Patient/Family location: Home [facility] Provider location: [facility]  All persons participating in visit: [name] and Tina Blake Tina Blake   Types of Service: Individual psychotherapy and Video visit  I connected with Charmaine JONELLE Hargadon and/or Charmaine JONELLE Lebron's n/a via  Telephone or Video Enabled Telemedicine Application  (Video is Caregility application) and verified that I am speaking with the correct person using two identifiers. Discussed confidentiality: Yes   I discussed the limitations of telemedicine and the availability of in person appointments.  Discussed there is a possibility of technology failure and discussed alternative modes of communication if that failure occurs.  I discussed that engaging in this telemedicine visit, they consent to the provision of behavioral healthcare and the services will be billed under their insurance.  Patient and/or legal guardian expressed understanding and consented to Telemedicine visit: Yes   Presenting Concerns: Patient and/or family reports the following symptoms/concerns: Sleep is continuing to improve (taking about 30 minutes to fall asleep, instead of 3 hours prior to medications); has not had appointment scheduled for psychiatry yet; ongoing conflict with specific difficult people in life.  Duration of problem: Ongoing; Severity of problem: moderately severe  Patient and/or Family's Strengths/Protective Factors: Social connections, Concrete supports in place (healthy food, safe environments, etc.), and Sense of purpose  Goals Addressed: Patient will:  Reduce symptoms of: anxiety,  depression, insomnia, and stress   Increase knowledge and/or ability of: stress reduction   Demonstrate ability to: Increase healthy adjustment to current life circumstances and Increase motivation to adhere to plan of care  Progress towards Goals: Ongoing  Interventions: Interventions utilized:  Motivational Interviewing, Medication Monitoring, and Supportive Reflection Standardized Assessments completed: Not Needed    Patient and/or Family Response: Patient agrees with treatment plan.   Clinical Assessment/Diagnosis  PTSD (post-traumatic stress disorder).   Patient may benefit from continued therapeutic intervention  .  Plan: Follow up with behavioral health clinician on : Two weeks Behavioral recommendations:  -Continue taking BH medication as prescribed (Prozac , Trazodone , hydroxyzine ) -Continue plan to return psychiatry call; accept psychiatry referral; use BH Urgent Care if needed and discussed -Continue daily self-coping strategies as needed (increased time with supportive people; decreased time with unsupportive people; poison arrow/invisible cape strategy as needed with difficult/unsupportive people) Referral(s): Integrated [employer] (In Clinic) and [facility] (LME/Outside Clinic)  I discussed the assessment and treatment plan with the patient and/or parent/guardian. They were provided an opportunity to ask questions and all were answered. They agreed with the plan and demonstrated an understanding of the instructions.   They were advised to call back or seek an in-person evaluation if the symptoms worsen or if the condition fails to improve as anticipated.  Warren BROCKS Brinly Maietta, LCSW     08/18/2023    2:11 PM 04/28/2023    3:29 PM 04/24/2022   10:30 AM 01/26/2022    3:00 PM 12/22/2021    3:06 PM  Depression screen PHQ 2/9  Decreased Interest 0 0 1 0 0  Down, Depressed, Hopeless 1 1 0 1 0  PHQ - 2 Score 1 1 1 1  0  Altered sleeping  3 2 2  0  Tired, decreased energy 1 2 0 0   Change in appetite 0 0 1 1   Feeling bad or failure about yourself  2 1 0 0   Trouble concentrating 0 0 0 0   Moving slowly or fidgety/restless 0 0 0 0   Suicidal thoughts 0 0 0 0   PHQ-9 Score 7 6 4 2    Difficult doing work/chores   Not difficult at all Not difficult at all       08/23/2023    4:12 PM 04/28/2023    3:29 PM 04/24/2022   10:31 AM 01/26/2022    3:01 PM  GAD 7 : Generalized Anxiety Score  Nervous, Anxious, on Edge 1 2 2 1   Control/stop worrying 3 2 2 1   Worry too much - different things 3 3 2 1   Trouble relaxing 0 2 0 0  Restless 0 1 0 0  Easily annoyed or irritable 1 3 0 1  Afraid - awful might happen 0 3 2 1   Total GAD 7 Score 8 16 8 5   Anxiety Difficulty   Not difficult at all Somewhat difficult

## 2023-10-18 ENCOUNTER — Ambulatory Visit (INDEPENDENT_AMBULATORY_CARE_PROVIDER_SITE_OTHER): Admitting: Clinical

## 2023-10-18 DIAGNOSIS — F4310 Post-traumatic stress disorder, unspecified: Secondary | ICD-10-CM

## 2023-10-18 NOTE — BH Specialist Note (Signed)
 Integrated Behavioral Health via Telemedicine Visit  10/18/2023 Tina Blake [id number]  Number of Integrated Behavioral Health Clinician visits: 4- Fourth Visit  Session Start time: 1630   Session End time: 1650  Total time in minutes: 20  Referring Provider: Nidia Daring, FNP Patient/Family location: Home [facility] Provider location: Center for [facility] at [facility] for Women  All persons participating in visit: [name] and [facility] Tina Blake   Types of Service: Individual psychotherapy and Video visit  I connected with Tina Blake and/or Tina Blake's n/a via  Telephone or Video Enabled Telemedicine Application  (Video is Caregility application) and verified that I am speaking with the correct person using two identifiers. Discussed confidentiality: Yes   I discussed the limitations of telemedicine and the availability of in person appointments.  Discussed there is a possibility of technology failure and discussed alternative modes of communication if that failure occurs.  I discussed that engaging in this telemedicine visit, they consent to the provision of behavioral healthcare and the services will be billed under their insurance.  Patient and/or legal guardian expressed understanding and consented to Telemedicine visit: Yes   Presenting Concerns: Patient and/or family reports the following symptoms/concerns: Increased anxiety the past two weeks, attributes to conflict with coworker, as well as missing one day's dosage of Prozac . Pt is requesting increase dosage of Prozac  to 60mg , after taking once and feeling relief. Pt has received phone message from psychiatry; has made attempts to call back; will try again until appointment is set. Passive SI with no intent and no plan, as previous.  Duration of problem: Ongoing; Severity of problem: moderately severe  Patient and/or Family's Strengths/Protective Factors: Social connections,  Concrete supports in place (healthy food, safe environments, etc.), and Sense of purpose  Goals Addressed: Patient will:  Reduce symptoms of: anxiety, depression, mood instability, and stress   Increase knowledge and/or ability of: stress reduction   Demonstrate ability to: Increase healthy adjustment to current life circumstances and Increase motivation to adhere to plan of care  Progress towards Goals: Ongoing    Interventions: Interventions utilized:  Motivational Interviewing, Medication Monitoring, and Supportive Reflection Standardized Assessments completed: GAD-7 and PHQ 9    Patient and/or Family Response: Patient agrees with treatment plan.   Clinical Assessment/Diagnosis  PTSD (post-traumatic stress disorder) .   Patient may benefit from continued therapeutic intervention and establishing care with psychiatry and ongoing therapy .  Plan: Follow up with behavioral health clinician on : Two weeks Behavioral recommendations:  -Continue taking BH medications as prescribed (Prozac , hydroxyzine , Trazodone ); will be notified with any changes -Continue plan to call back psychiatry today to schedule initial appointment -Continue maintaining healthy self care, along with spending time with supportive people; decrease time with unsupportive people Referral(s): Integrated [employer] (In Clinic)  I discussed the assessment and treatment plan with the patient and/or parent/guardian. They were provided an opportunity to ask questions and all were answered. They agreed with the plan and demonstrated an understanding of the instructions.   They were advised to call back or seek an in-person evaluation if the symptoms worsen or if the condition fails to improve as anticipated.  Warren BROCKS Ernestine Langworthy, LCSW     10/18/2023    4:28 PM 08/18/2023    2:11 PM 04/28/2023    3:29 PM 04/24/2022   10:30 AM 01/26/2022    3:00 PM  Depression screen PHQ 2/9  Decreased Interest 3 0 0 1  0  Down,  Depressed, Hopeless 1 1 1  0 1  PHQ - 2 Score 4 1 1 1 1   Altered sleeping 0 3 2 2  0  Tired, decreased energy 3 1 2  0 0  Change in appetite 0 0 0 1 1  Feeling bad or failure about yourself  1 2 1  0 0  Trouble concentrating 3 0 0 0 0  Moving slowly or fidgety/restless 0 0 0 0 0  Suicidal thoughts 1 0 0 0 0  PHQ-9 Score 12 7 6 4 2   Difficult doing work/chores    Not difficult at all Not difficult at all      10/18/2023    4:29 PM 08/23/2023    4:12 PM 04/28/2023    3:29 PM 04/24/2022   10:31 AM  GAD 7 : Generalized Anxiety Score  Nervous, Anxious, on Edge 3 1 2 2   Control/stop worrying 3 3 2 2   Worry too much - different things 3 3 3 2   Trouble relaxing 3 0 2 0  Restless 1 0 1 0  Easily annoyed or irritable 3 1 3  0  Afraid - awful might happen 3 0 3 2  Total GAD 7 Score 19 8 16 8   Anxiety Difficulty    Not difficult at all

## 2023-10-21 ENCOUNTER — Other Ambulatory Visit: Payer: Self-pay | Admitting: Obstetrics and Gynecology

## 2023-10-21 DIAGNOSIS — F419 Anxiety disorder, unspecified: Secondary | ICD-10-CM

## 2023-10-21 MED ORDER — FLUOXETINE HCL 20 MG PO CAPS: 60.0000 mg | ORAL_CAPSULE | Freq: Every day | ORAL | 11 refills | Status: AC

## 2023-10-21 MED ORDER — FLUOXETINE HCL 60 MG PO TABS: 60.0000 mg | ORAL_TABLET | Freq: Every day | ORAL | 6 refills | Status: DC

## 2023-10-21 NOTE — Addendum Note (Signed)
 Addended by: DELORES NIDIA CROME on: 10/21/2023 04:56 PM   Modules accepted: Orders

## 2023-10-21 NOTE — Progress Notes (Signed)
 Rx Increased fluoxetine 

## 2023-11-01 ENCOUNTER — Ambulatory Visit (INDEPENDENT_AMBULATORY_CARE_PROVIDER_SITE_OTHER): Admitting: Clinical

## 2023-11-01 DIAGNOSIS — F4310 Post-traumatic stress disorder, unspecified: Secondary | ICD-10-CM

## 2023-11-01 NOTE — BH Specialist Note (Unsigned)
 Integrated Behavioral Health via Telemedicine Visit  11/02/2023 Tina Blake [id number]  Number of Integrated Behavioral Health Clinician visits: 5-Fifth Visit  Session Start time: 1603   Session End time: 1639  Total time in minutes: 36  Referring Provider: Nidia Daring, FNP Patient/Family location: Home [facility] Provider location: Center for [facility] Healthcare at [facility] for Women  All persons participating in visit: [name] and [facility] Tina Blake   Types of Service: Individual psychotherapy and Video visit  I connected with Tina Blake and/or Tina Blake's n/a via  Telephone or Video Enabled Telemedicine Application  (Video is Caregility application) and verified that I am speaking with the correct person using two identifiers. Discussed confidentiality: Yes   I discussed the limitations of telemedicine and the availability of in person appointments.  Discussed there is a possibility of technology failure and discussed alternative modes of communication if that failure occurs.  I discussed that engaging in this telemedicine visit, they consent to the provision of behavioral healthcare and the services will be billed under their insurance.  Patient and/or legal guardian expressed understanding and consented to Telemedicine visit: Yes   Presenting Concerns: Patient and/or family reports the following symptoms/concerns: Working on maintaining work-life balance; procrastination at work by talking to coworkers leads to unfinished work to take home, with no energy to complete; mood improvement taking Prozac  at increased dosage. Duration of problem: Ongoing; Severity of problem: moderate  Patient and/or Family's Strengths/Protective Factors: Social connections, Concrete supports in place (healthy food, safe environments, etc.), and Sense of purpose  Goals Addressed: Patient will:  Reduce symptoms of: anxiety, depression, mood instability,  and stress   Increase knowledge and/or ability of: stress reduction   Demonstrate ability to: Increase healthy adjustment to current life circumstances and Increase motivation to adhere to plan of care  Progress towards Goals: Ongoing    Interventions: Interventions utilized:  Solution-Focused Strategies Standardized Assessments completed: Not Needed    Patient and/or Family Response: Patient agrees with treatment plan.   Clinical Assessment/Diagnosis  PTSD (post-traumatic stress disorder)   Patient may benefit from continued therapeutic intervention   Plan: Follow up with behavioral health clinician on : Three weeks Behavioral recommendations:  -Continue taking Prozac , Trazadone and Hydroxyzine  as prescribed -Set timer after students leave, to complete 30 minutes worth of work, before conversation with colleagues; consider repeating this at least three times/week -Continue healthy self-care strategies (firm boundary on no response to work calls or emails once home; healthy sleep and meals; increased time with supportive people in life) -Continue plan to call and schedule appointment with psychiatry Referral(s): Integrated [employer] (In Clinic)  I discussed the assessment and treatment plan with the patient and/or parent/guardian. They were provided an opportunity to ask questions and all were answered. They agreed with the plan and demonstrated an understanding of the instructions.   They were advised to call back or seek an in-person evaluation if the symptoms worsen or if the condition fails to improve as anticipated.  Tina Blake Tina Percival, LCSW     10/18/2023    4:28 PM 08/18/2023    2:11 PM 04/28/2023    3:29 PM 04/24/2022   10:30 AM 01/26/2022    3:00 PM  Depression screen PHQ 2/9  Decreased Interest 3 0 0 1 0  Down, Depressed, Hopeless 1 1 1  0 1  PHQ - 2 Score 4 1 1 1 1   Altered sleeping 0 3 2 2  0  Tired, decreased energy 3  1 2 0 0  Change in appetite 0  0 0 1 1  Feeling bad or failure about yourself  1 2 1  0 0  Trouble concentrating 3 0 0 0 0  Moving slowly or fidgety/restless 0 0 0 0 0  Suicidal thoughts 1 0 0 0 0  PHQ-9 Score 12 7 6 4 2   Difficult doing work/chores    Not difficult at all Not difficult at all      10/18/2023    4:29 PM 08/23/2023    4:12 PM 04/28/2023    3:29 PM 04/24/2022   10:31 AM  GAD 7 : Generalized Anxiety Score  Nervous, Anxious, on Edge 3 1 2 2   Control/stop worrying 3 3 2 2   Worry too much - different things 3 3 3 2   Trouble relaxing 3 0 2 0  Restless 1 0 1 0  Easily annoyed or irritable 3 1 3  0  Afraid - awful might happen 3 0 3 2  Total GAD 7 Score 19 8 16 8   Anxiety Difficulty    Not difficult at all

## 2023-11-15 ENCOUNTER — Ambulatory Visit: Payer: Self-pay

## 2023-11-15 NOTE — Telephone Encounter (Signed)
 No same day availability, pt is going to either UC or ED (states both are next to her apartment). This RN requested she call after to f/u.  FYI Only or Action Required?: FYI only for provider.  Patient was last seen in primary care on 04/24/2022 by Mauro Elveria BROCKS, NP.  Called Nurse Triage reporting Dizziness.  Symptoms began today.  Interventions attempted: Rest, hydration, or home remedies.  Symptoms are: unchanged.  Triage Disposition: See HCP Within 4 Hours (Or PCP Triage) (overriding See Physician Within 24 Hours)  Patient/caregiver understands and will follow disposition?: Yes  Reason for Disposition  [1] MODERATE dizziness (e.g., interferes with normal activities) AND [2] has NOT been evaluated by doctor (or NP/PA) for this  (Exception: Dizziness caused by heat exposure, sudden standing, or poor fluid intake.)  Answer Assessment - Initial Assessment Questions Pt experienced dizziness/lightheadedness at school and was told her BP was low (unknown level). Denies hx of DM.   Pt reports very dark brown stools and nausea, denies vomiting.  1. DESCRIPTION: Describe your dizziness.     Weakness 2. LIGHTHEADED: Do you feel lightheaded? (e.g., somewhat faint, woozy, weak upon standing)     Yes 3. VERTIGO: Do you feel like either you or the room is spinning or tilting? (i.e., vertigo)     Denies 4. SEVERITY: How bad is it?  Do you feel like you are going to faint? Can you stand and walk?     Has to hold onto walls/objects 5. ONSET:  When did the dizziness begin?     Today 6. AGGRAVATING FACTORS: Does anything make it worse? (e.g., standing, change in head position)     Changing positions, loud noises 7. HEART RATE: Can you tell me your heart rate? How many beats in 15 seconds?  (Note: Not all patients can do this.)       Unknown, states nurse told her it was high 10. OTHER SYMPTOMS: Do you have any other symptoms? (e.g., fever, chest pain, vomiting,  diarrhea, bleeding)       Denies 11. PREGNANCY: Is there any chance you are pregnant? When was your last menstrual period?       Denies  Protocols used: Dizziness - Lightheadedness-A-AH

## 2023-11-15 NOTE — Telephone Encounter (Signed)
 Pt disconnected prior to transfer - calling pt back.  Called pt - call went to VM - left message. Will place in call backs. Copied from CRM [id number]. Topic: Clinical - Red Word Triage >> Nov 15, 2023 12:23 PM Everette C wrote: [facility] that prompted transfer to Nurse Triage: The patient shares that their BP is currently 50/? And they're experiencing significant shaking

## 2023-11-22 ENCOUNTER — Ambulatory Visit: Admitting: Clinical

## 2023-11-22 DIAGNOSIS — F4310 Post-traumatic stress disorder, unspecified: Secondary | ICD-10-CM | POA: Diagnosis not present

## 2023-11-22 NOTE — BH Specialist Note (Unsigned)
 Integrated Behavioral Health via Telemedicine Visit  11/24/2023 Tina Blake [id number]  Number of Integrated Behavioral Health Clinician visits: 5-Fifth Visit  Session Start time: 1603   Session End time: 1639  Total time in minutes: 36  Referring Provider: Nidia Daring, FNP Patient/Family location: Home [facility] Provider location: Center for [facility] Healthcare at [facility] for Women  All persons participating in visit: [name] and [facility] Eljay Lave   Types of Service: Individual psychotherapy and Video visit  I connected with Tina Blake's n/a via  Telephone or Video Enabled Telemedicine Application  (Video is Caregility application) and verified that I am speaking with the correct person using two identifiers. Discussed confidentiality: Yes   I discussed the limitations of telemedicine and the availability of in person appointments.  Discussed there is a possibility of technology failure and discussed alternative modes of communication if that failure occurs.  I discussed that engaging in this telemedicine visit, they consent to the provision of behavioral healthcare and the services will be billed under their insurance.  Patient and/or legal guardian expressed understanding and consented to Telemedicine visit: Yes   Presenting Concerns: Patient and/or family reports the following symptoms/concerns: Processing emotional distress after having to miss work due to sickness; attributes to growing up with the value that work comes first, even when sick. Pt finds herself overthinking and at times thinking she's not good enough; conflicted emotion about coworker/friend's negative behaviors.  Duration of problem: Ongoing; Severity of problem: moderate  Patient and/or Family's Strengths/Protective Factors: Social connections, Concrete supports in place (healthy food, safe environments, etc.), and Sense of purpose  Goals  Addressed: Patient will:  Reduce symptoms of: anxiety, depression, mood instability, and stress   Increase knowledge and/or ability of: stress reduction   Demonstrate ability to: Increase motivation to adhere to plan of care  Progress towards Goals: Ongoing   Interventions: Interventions utilized:  Motivational Interviewing and Supportive Reflection Standardized Assessments completed: Not Needed   Patient and/or Family Response: Patient agrees with treatment plan.   Clinical Assessment/Diagnosis  PTSD (post-traumatic stress disorder)   Patient may benefit from continued therapeutic intervention    Plan: Follow up with behavioral health clinician on : Two weeks Behavioral recommendations:  -Continue taking Prozac , Trazadone and hydroxyzine  as prescribed -Continue getting work completed at work prior to socializing with colleagues, most work days, to keep caught up  -Continue healthy self-care daily (maintaining work/life balance by leaving work at work; healthy sleeping and meals; time with supportive people in life; setting healthy boundaries with others); consider taking sick days as part of overall healthy self-care Referral(s): Integrated [employer] (In Clinic)  I discussed the assessment and treatment plan with the patient and/or parent/guardian. They were provided an opportunity to ask questions and all were answered. They agreed with the plan and demonstrated an understanding of the instructions.   They were advised to call back or seek an in-person evaluation if the symptoms worsen or if the condition fails to improve as anticipated.  Warren BROCKS Xochil Shanker, LCSW     10/18/2023    4:28 PM 08/18/2023    2:11 PM 04/28/2023    3:29 PM 04/24/2022   10:30 AM 01/26/2022    3:00 PM  Depression screen PHQ 2/9  Decreased Interest 3 0 0 1 0  Down, Depressed, Hopeless 1 1 1  0 1  PHQ - 2 Score 4 1 1 1 1   Altered sleeping 0 3 2 2  0  Tired, decreased energy 3 1 2  0 0   Change in appetite 0 0 0 1 1  Feeling bad or failure about yourself  1 2 1  0 0  Trouble concentrating 3 0 0 0 0  Moving slowly or fidgety/restless 0 0 0 0 0  Suicidal thoughts 1 0 0 0 0  PHQ-9 Score 12 7 6 4 2   Difficult doing work/chores    Not difficult at all Not difficult at all      10/18/2023    4:29 PM 08/23/2023    4:12 PM 04/28/2023    3:29 PM 04/24/2022   10:31 AM  GAD 7 : Generalized Anxiety Score  Nervous, Anxious, on Edge 3 1 2 2   Control/stop worrying 3 3 2 2   Worry too much - different things 3 3 3 2   Trouble relaxing 3 0 2 0  Restless 1 0 1 0  Easily annoyed or irritable 3 1 3  0  Afraid - awful might happen 3 0 3 2  Total GAD 7 Score 19 8 16 8   Anxiety Difficulty    Not difficult at all

## 2023-12-09 NOTE — BH Specialist Note (Signed)
 Integrated Behavioral Health via Telemedicine Visit  12/20/2023 Tina Blake [id number]  Number of Integrated Behavioral Health Clinician visits: 6-Sixth Visit  Session Start time: 1551   Session End time: 1619  Total time in minutes: 28  Referring Provider: Nidia Daring, FNP Patient/Family location: Home [facility] Provider location: Center for [facility] Healthcare at [facility] for Women  All persons participating in visit: [name] and [facility] Tina Blake   Types of Service: Individual psychotherapy and Video visit  I connected with Tina Blake and/or Tina Blake's n/a via  Telephone or Video Enabled Telemedicine Application  (Video is Caregility application) and verified that I am speaking with the correct person using two identifiers. Discussed confidentiality: Yes   I discussed the limitations of telemedicine and the availability of in person appointments.  Discussed there is a possibility of technology failure and discussed alternative modes of communication if that failure occurs.  I discussed that engaging in this telemedicine visit, they consent to the provision of behavioral healthcare and the services will be billed under their insurance.  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 Pt is coping with this by enjoying a much-needed holiday break.  Duration of problem: Ongoing this school year; Severity of problem: moderate  Patient and/or Family's Strengths/Protective Factors: Social  connections, Concrete supports in place (healthy food, safe environments, etc.), and Sense of purpose  Goals Addressed: Patient will:  Reduce symptoms of: anxiety, insomnia, mood instability, and stress   Increase knowledge and/or ability of: stress reduction   Demonstrate ability to: Increase healthy adjustment to current life circumstances  Progress towards Goals: Ongoing  Interventions: Interventions utilized:  Supportive Reflection Standardized Assessments completed: Not Needed    Patient and/or Family Response: Patient agrees with treatment plan.   Clinical Assessment/Diagnosis  PTSD (post-traumatic stress disorder)  Work-related stress   Patient may benefit from continued therapeutic intervention.  Plan: Follow up with behavioral health clinician on : One month Behavioral recommendations:  -Continue taking Prozac , Trazadone, hydroxyzine  as prescribed -Continue plan to enjoy Thanksgiving meal and time off work this week/weekend -Continue prioritizing healthy self-care daily (work/life balance; healthy sleep/meals; time with supportive people in life;time with pets;  healthy boundaries with others; working on being okay taking sick days as needed) Referral(s): Integrated [employer] (In Clinic)  I discussed the assessment and treatment plan with the patient and/or parent/guardian. They were provided an opportunity to ask questions and all were answered. They agreed with the plan and demonstrated an understanding of the instructions.   They were advised to call back or seek an in-person evaluation if the symptoms worsen or if the condition fails to improve as anticipated.  Warren BROCKS Tina Greif, LCSW     10/18/2023    4:28 PM 08/18/2023    2:11 PM 04/28/2023    3:29 PM 04/24/2022   10:30 AM 01/26/2022    3:00 PM  Depression screen PHQ 2/9  Decreased Interest 3 0 0 1 0  Down, Depressed, Hopeless 1 1 1  0 1  PHQ - 2 Score 4 1 1 1 1   Altered sleeping 0  3 2 2  0   Tired, decreased energy 3 1 2  0 0  Change in appetite 0 0 0 1 1  Feeling bad or failure about yourself  1 2 1  0 0  Trouble concentrating 3 0 0 0 0  Moving slowly or fidgety/restless 0 0 0 0 0  Suicidal thoughts 1 0 0 0 0  PHQ-9 Score 12  7  6  4  2    Difficult doing work/chores    Not difficult at all Not difficult at all     Data saved with a previous flowsheet row definition      10/18/2023    4:29 PM 08/23/2023    4:12 PM 04/28/2023    3:29 PM 04/24/2022   10:31 AM  GAD 7 : Generalized Anxiety Score  Nervous, Anxious, on Edge 3 1 2 2   Control/stop worrying 3 3 2 2   Worry too much - different things 3 3 3 2   Trouble relaxing 3 0 2 0  Restless 1 0 1 0  Easily annoyed or irritable 3 1 3  0  Afraid - awful might happen 3 0 3 2  Total GAD 7 Score 19 8 16 8   Anxiety Difficulty    Not difficult at all

## 2023-12-15 ENCOUNTER — Ambulatory Visit: Admitting: Clinical

## 2023-12-15 DIAGNOSIS — Z566 Other physical and mental strain related to work: Secondary | ICD-10-CM

## 2023-12-15 DIAGNOSIS — F4310 Post-traumatic stress disorder, unspecified: Secondary | ICD-10-CM
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
## 2024-01-19 ENCOUNTER — Ambulatory Visit: Admitting: Clinical

## 2024-01-19 DIAGNOSIS — F4310 Post-traumatic stress disorder, unspecified: Secondary | ICD-10-CM | POA: Diagnosis not present

## 2024-01-21 NOTE — BH Specialist Note (Signed)
 Pt did not arrive to video visit and did not answer the phone; Unable to leave voice message; left MyChart message for patient.

## 2024-02-03 ENCOUNTER — Ambulatory Visit: Payer: Self-pay | Admitting: Clinical

## 2024-02-03 DIAGNOSIS — Z91199 Patient's noncompliance with other medical treatment and regimen due to unspecified reason: Secondary | ICD-10-CM
